# Patient Record
Sex: Female | Born: 1975 | Race: White | Hispanic: No | Marital: Married | Smoking: Never smoker
Health system: Southern US, Community
[De-identification: ages and names within clinical notes are randomized; demographics above are authoritative.]

## PROBLEM LIST (undated history)

## (undated) DIAGNOSIS — E119 Type 2 diabetes mellitus without complications: Secondary | ICD-10-CM

## (undated) DIAGNOSIS — C801 Malignant (primary) neoplasm, unspecified: Secondary | ICD-10-CM

## (undated) DIAGNOSIS — E0590 Thyrotoxicosis, unspecified without thyrotoxic crisis or storm: Secondary | ICD-10-CM

## (undated) HISTORY — PX: THYROIDECTOMY, PARTIAL: SHX18

## (undated) HISTORY — PX: WISDOM TOOTH EXTRACTION: SHX21

## (undated) HISTORY — PX: CERVICAL FUSION: SHX112

## (undated) HISTORY — PX: ABCESS DRAINAGE: SHX399

---

## 2003-10-19 ENCOUNTER — Other Ambulatory Visit: Admission: RE | Admit: 2003-10-19 | Discharge: 2003-10-19 | Payer: Self-pay | Admitting: *Deleted

## 2008-11-24 ENCOUNTER — Encounter: Admission: RE | Admit: 2008-11-24 | Discharge: 2008-11-24 | Payer: Self-pay | Admitting: Neurology

## 2010-09-16 ENCOUNTER — Encounter: Payer: Self-pay | Admitting: Neurology

## 2011-04-08 ENCOUNTER — Other Ambulatory Visit (HOSPITAL_COMMUNITY): Payer: Self-pay | Admitting: Gastroenterology

## 2011-04-08 DIAGNOSIS — R945 Abnormal results of liver function studies: Secondary | ICD-10-CM

## 2011-04-09 ENCOUNTER — Other Ambulatory Visit: Payer: Self-pay | Admitting: Gastroenterology

## 2011-04-09 ENCOUNTER — Ambulatory Visit (HOSPITAL_COMMUNITY)
Admission: RE | Admit: 2011-04-09 | Discharge: 2011-04-09 | Disposition: A | Discharge status: Home / Self Care | Payer: BC Managed Care – PPO | Source: Ambulatory Visit | Attending: Gastroenterology | Admitting: Gastroenterology

## 2011-04-09 DIAGNOSIS — K7689 Other specified diseases of liver: Secondary | ICD-10-CM | POA: Insufficient documentation

## 2011-04-09 DIAGNOSIS — R7989 Other specified abnormal findings of blood chemistry: Secondary | ICD-10-CM | POA: Insufficient documentation

## 2011-04-09 DIAGNOSIS — R945 Abnormal results of liver function studies: Secondary | ICD-10-CM

## 2011-04-09 LAB — PROTIME-INR
INR: 1.01 (ref 0.00–1.49)
Prothrombin Time: 13.5 seconds (ref 11.6–15.2)

## 2011-04-09 LAB — CBC
MCH: 27.8 pg (ref 26.0–34.0)
MCHC: 32.5 g/dL (ref 30.0–36.0)
Platelets: 240 10*3/uL (ref 150–400)
RDW: 13.7 % (ref 11.5–15.5)

## 2011-04-09 LAB — GLUCOSE, CAPILLARY: Glucose-Capillary: 213 mg/dL — ABNORMAL HIGH (ref 70–99)

## 2011-04-09 LAB — APTT: aPTT: 30 seconds (ref 24–37)

## 2012-03-02 ENCOUNTER — Other Ambulatory Visit: Payer: Self-pay | Admitting: Internal Medicine

## 2012-03-02 DIAGNOSIS — C73 Malignant neoplasm of thyroid gland: Secondary | ICD-10-CM

## 2012-03-06 ENCOUNTER — Ambulatory Visit
Admission: RE | Admit: 2012-03-06 | Discharge: 2012-03-06 | Disposition: A | Discharge status: Home / Self Care | Payer: BC Managed Care – PPO | Source: Ambulatory Visit | Attending: Internal Medicine | Admitting: Internal Medicine

## 2012-03-06 DIAGNOSIS — C73 Malignant neoplasm of thyroid gland: Secondary | ICD-10-CM

## 2012-09-09 ENCOUNTER — Other Ambulatory Visit: Payer: Self-pay | Admitting: Internal Medicine

## 2012-09-09 DIAGNOSIS — C73 Malignant neoplasm of thyroid gland: Secondary | ICD-10-CM

## 2012-09-14 ENCOUNTER — Ambulatory Visit
Admission: RE | Admit: 2012-09-14 | Discharge: 2012-09-14 | Disposition: A | Discharge status: Home / Self Care | Payer: BC Managed Care – PPO | Source: Ambulatory Visit | Attending: Internal Medicine | Admitting: Internal Medicine

## 2012-09-14 DIAGNOSIS — C73 Malignant neoplasm of thyroid gland: Secondary | ICD-10-CM

## 2015-01-05 ENCOUNTER — Other Ambulatory Visit: Payer: Self-pay | Admitting: Internal Medicine

## 2015-01-05 DIAGNOSIS — C73 Malignant neoplasm of thyroid gland: Secondary | ICD-10-CM

## 2015-02-23 ENCOUNTER — Other Ambulatory Visit: Payer: BC Managed Care – PPO

## 2015-02-28 ENCOUNTER — Ambulatory Visit
Admission: RE | Admit: 2015-02-28 | Discharge: 2015-02-28 | Disposition: A | Discharge status: Home / Self Care | Payer: BC Managed Care – PPO | Source: Ambulatory Visit | Attending: Internal Medicine | Admitting: Internal Medicine

## 2015-02-28 DIAGNOSIS — C73 Malignant neoplasm of thyroid gland: Secondary | ICD-10-CM

## 2016-02-01 ENCOUNTER — Other Ambulatory Visit: Payer: Self-pay | Admitting: Internal Medicine

## 2016-02-01 DIAGNOSIS — R1010 Upper abdominal pain, unspecified: Secondary | ICD-10-CM

## 2016-02-07 ENCOUNTER — Other Ambulatory Visit: Payer: BC Managed Care – PPO

## 2016-02-12 ENCOUNTER — Other Ambulatory Visit: Payer: BC Managed Care – PPO

## 2016-02-13 ENCOUNTER — Ambulatory Visit
Admission: RE | Admit: 2016-02-13 | Discharge: 2016-02-13 | Disposition: A | Discharge status: Home / Self Care | Payer: BC Managed Care – PPO | Source: Ambulatory Visit | Attending: Internal Medicine | Admitting: Internal Medicine

## 2016-02-13 DIAGNOSIS — R1010 Upper abdominal pain, unspecified: Secondary | ICD-10-CM

## 2016-09-03 ENCOUNTER — Other Ambulatory Visit: Payer: Self-pay | Admitting: Internal Medicine

## 2016-09-03 ENCOUNTER — Ambulatory Visit
Admission: RE | Admit: 2016-09-03 | Discharge: 2016-09-03 | Disposition: A | Discharge status: Home / Self Care | Payer: BC Managed Care – PPO | Source: Ambulatory Visit | Attending: Internal Medicine | Admitting: Internal Medicine

## 2016-09-03 DIAGNOSIS — J209 Acute bronchitis, unspecified: Secondary | ICD-10-CM

## 2016-11-12 ENCOUNTER — Other Ambulatory Visit: Payer: Self-pay | Admitting: Internal Medicine

## 2016-11-12 DIAGNOSIS — E890 Postprocedural hypothyroidism: Secondary | ICD-10-CM

## 2016-11-12 DIAGNOSIS — C73 Malignant neoplasm of thyroid gland: Secondary | ICD-10-CM

## 2016-11-12 DIAGNOSIS — Z85850 Personal history of malignant neoplasm of thyroid: Secondary | ICD-10-CM

## 2016-11-18 ENCOUNTER — Other Ambulatory Visit: Payer: Self-pay | Admitting: Internal Medicine

## 2016-11-18 ENCOUNTER — Ambulatory Visit
Admission: RE | Admit: 2016-11-18 | Discharge: 2016-11-18 | Disposition: A | Discharge status: Home / Self Care | Payer: BC Managed Care – PPO | Source: Ambulatory Visit | Attending: Internal Medicine | Admitting: Internal Medicine

## 2016-11-18 DIAGNOSIS — C73 Malignant neoplasm of thyroid gland: Secondary | ICD-10-CM

## 2016-11-18 DIAGNOSIS — E890 Postprocedural hypothyroidism: Secondary | ICD-10-CM

## 2016-11-18 DIAGNOSIS — R109 Unspecified abdominal pain: Secondary | ICD-10-CM

## 2016-11-18 DIAGNOSIS — Z85850 Personal history of malignant neoplasm of thyroid: Secondary | ICD-10-CM

## 2016-11-19 ENCOUNTER — Other Ambulatory Visit: Payer: BC Managed Care – PPO

## 2016-11-28 ENCOUNTER — Ambulatory Visit
Admission: RE | Admit: 2016-11-28 | Discharge: 2016-11-28 | Disposition: A | Discharge status: Home / Self Care | Payer: BC Managed Care – PPO | Source: Ambulatory Visit | Attending: Internal Medicine | Admitting: Internal Medicine

## 2016-11-28 DIAGNOSIS — R109 Unspecified abdominal pain: Secondary | ICD-10-CM

## 2017-09-23 ENCOUNTER — Other Ambulatory Visit: Payer: Self-pay | Admitting: Internal Medicine

## 2017-09-23 DIAGNOSIS — L680 Hirsutism: Secondary | ICD-10-CM

## 2017-09-23 DIAGNOSIS — N915 Oligomenorrhea, unspecified: Secondary | ICD-10-CM

## 2017-09-30 ENCOUNTER — Ambulatory Visit: Payer: Self-pay | Admitting: Podiatry

## 2017-09-30 ENCOUNTER — Ambulatory Visit (INDEPENDENT_AMBULATORY_CARE_PROVIDER_SITE_OTHER): Payer: BC Managed Care – PPO

## 2017-09-30 DIAGNOSIS — M722 Plantar fascial fibromatosis: Secondary | ICD-10-CM | POA: Diagnosis not present

## 2017-09-30 MED ORDER — MELOXICAM 15 MG PO TABS: 15.0000 mg | ORAL_TABLET | Freq: Every day | ORAL | 3 refills | Status: DC

## 2017-09-30 NOTE — Patient Instructions (Signed)

## 2017-09-30 NOTE — Progress Notes (Signed)
  Subjective:  Patient ID: Natalie Ramirez, female    DOB: 1976/08/02,  MRN: [id number] HPI Chief Complaint  Patient presents with  . Foot Pain    [name]; 7/10 achy pain -No injury Tx: icing, stretching, and cusotm orthotics (helps)    42 y.o. female presents with the above complaint.     No past medical history on file.  No current outpatient medications on file.  Allergies not on file Review of Systems  All other systems reviewed and are negative.  Objective:  There were no vitals filed for this visit.  General: Well developed, nourished, in no acute distress, alert and oriented x3   Dermatological: Skin is warm, dry and supple bilateral. Nails x 10 are well maintained; remaining integument appears unremarkable at this time. There are no open sores, no preulcerative lesions, no rash or signs of infection present.  Vascular: Dorsalis Pedis artery and Posterior Tibial artery pedal pulses are 2/4 bilateral with immedate capillary fill time. Pedal hair growth present. No varicosities and no lower extremity edema present bilateral.   Neruologic: Grossly intact via light touch bilateral. Vibratory intact via tuning fork bilateral. Protective threshold with Semmes Wienstein monofilament intact to all pedal sites bilateral. Patellar and Achilles deep tendon reflexes 2+ bilateral. No Babinski or clonus noted bilateral.   Musculoskeletal: No gross boney pedal deformities bilateral. No pain, crepitus, or limitation noted with foot and ankle range of motion bilateral. Muscular strength 5/5 in all groups tested bilateral.  She has pain on palpation medial calcaneal tubercle of the bilateral foot.  Gait: Unassisted, Nonantalgic.    Radiographs:  3 views of bilateral foot were taken today no acute derangement is noted.  She does have plantar and posterior calcaneal heel spurs with a soft tissue increase in density at the plantar fascial calcaneal  insertion site.  Pes planus is also observed.  No coalitions are noted.  Assessment & Plan:   Assessment: Chronic intractable plantar fasciitis times 4 years left greater than right.  Plan: Discussed etiology pathology conservative versus surgical therapies.  At this point since she is a diabetic we are not able to use prednisone.  I will use cortisone which was injected today 20 mg of Kenalog with 5 mg Marcaine to the point of maximal tenderness after sterile Betadine skin prep.  I also started her on meloxicam.  Placed on a plantar fascial brace bilateral and a night splint left.  We discussed appropriate shoe gear stretching exercises ice therapy and shoe modifications she is provided with both oral and written home-going instructions in care for her feet.  I will follow-up with her in 1 month.  She will bring her orthotics with her the next time she comes for [place] to evaluate him.     Natalie Ramirez T. [place], [place]

## 2017-10-02 ENCOUNTER — Other Ambulatory Visit: Payer: BC Managed Care – PPO

## 2017-11-06 ENCOUNTER — Encounter: Payer: Self-pay | Admitting: Podiatry

## 2017-11-06 ENCOUNTER — Ambulatory Visit: Payer: BC Managed Care – PPO | Admitting: Podiatry

## 2017-11-06 DIAGNOSIS — M722 Plantar fascial fibromatosis: Secondary | ICD-10-CM

## 2017-11-08 NOTE — Progress Notes (Signed)
She presents today for follow-up of bilateral plantar fasciitis.  She states that is doing so much better than it was before.  Objective: Vital signs are stable alert and oriented x3.  Pulses are palpable.  Neurologic sensorium is intact.  Deep tendon reflexes are intact muscle strength was 5/5 dorsiflexors plantar flexors inverters everters all intrinsic musculature is intact.  Assessment: Resolving plantar fasciitis.  Plan: Discussed with her in great detail today that she should continue all conservative therapies including plantar fascial brace night splint appropriate shoe gear stretching exercises ice therapy and medication.  I will follow-up with her in 1 month if necessary.

## 2017-11-17 ENCOUNTER — Other Ambulatory Visit: Payer: BC Managed Care – PPO | Admitting: Orthotics

## 2017-11-20 ENCOUNTER — Ambulatory Visit (INDEPENDENT_AMBULATORY_CARE_PROVIDER_SITE_OTHER): Payer: BC Managed Care – PPO | Admitting: Orthotics

## 2017-11-20 DIAGNOSIS — M722 Plantar fascial fibromatosis: Secondary | ICD-10-CM | POA: Diagnosis not present

## 2017-11-20 NOTE — Progress Notes (Signed)

## 2017-11-24 ENCOUNTER — Other Ambulatory Visit: Payer: BC Managed Care – PPO | Admitting: Orthotics

## 2017-12-11 ENCOUNTER — Ambulatory Visit: Payer: BC Managed Care – PPO | Admitting: Orthotics

## 2017-12-11 DIAGNOSIS — M722 Plantar fascial fibromatosis: Secondary | ICD-10-CM

## 2017-12-11 NOTE — Progress Notes (Signed)
Patient came in today to pick up custom made foot orthotics.  The goals were accomplished and the patient reported no dissatisfaction with said orthotics.  Patient was advised of breakin period and how to report any issues. 

## 2017-12-23 ENCOUNTER — Inpatient Hospital Stay (HOSPITAL_COMMUNITY): Payer: BC Managed Care – PPO

## 2017-12-23 ENCOUNTER — Inpatient Hospital Stay (HOSPITAL_COMMUNITY)
Admission: AD | Admit: 2017-12-23 | Discharge: 2017-12-23 | Disposition: A | Discharge status: Home / Self Care | Payer: BC Managed Care – PPO | Source: Ambulatory Visit | Attending: Obstetrics and Gynecology | Admitting: Obstetrics and Gynecology

## 2017-12-23 ENCOUNTER — Encounter (HOSPITAL_COMMUNITY): Payer: Self-pay | Admitting: *Deleted

## 2017-12-23 DIAGNOSIS — IMO0002 Reserved for concepts with insufficient information to code with codable children: Secondary | ICD-10-CM

## 2017-12-23 DIAGNOSIS — O200 Threatened abortion: Secondary | ICD-10-CM | POA: Diagnosis not present

## 2017-12-23 DIAGNOSIS — Z3A08 8 weeks gestation of pregnancy: Secondary | ICD-10-CM | POA: Diagnosis not present

## 2017-12-23 DIAGNOSIS — R799 Abnormal finding of blood chemistry, unspecified: Secondary | ICD-10-CM | POA: Diagnosis not present

## 2017-12-23 DIAGNOSIS — O209 Hemorrhage in early pregnancy, unspecified: Secondary | ICD-10-CM | POA: Diagnosis present

## 2017-12-23 HISTORY — DX: Type 2 diabetes mellitus without complications: E11.9

## 2017-12-23 LAB — URINALYSIS, ROUTINE W REFLEX MICROSCOPIC
Bilirubin Urine: NEGATIVE
Glucose, UA: NEGATIVE mg/dL
KETONES UR: NEGATIVE mg/dL
Leukocytes, UA: NEGATIVE
Nitrite: NEGATIVE
PH: 7 (ref 5.0–8.0)
PROTEIN: NEGATIVE mg/dL
Specific Gravity, Urine: 1.015 (ref 1.005–1.030)

## 2017-12-23 LAB — CBC
HCT: 35.7 % — ABNORMAL LOW (ref 36.0–46.0)
Hemoglobin: 11.6 g/dL — ABNORMAL LOW (ref 12.0–15.0)
MCH: 28.5 pg (ref 26.0–34.0)
MCHC: 32.5 g/dL (ref 30.0–36.0)
MCV: 87.7 fL (ref 78.0–100.0)
Platelets: 258 10*3/uL (ref 150–400)
RBC: 4.07 MIL/uL (ref 3.87–5.11)
RDW: 14.5 % (ref 11.5–15.5)
WBC: 12.5 10*3/uL — AB (ref 4.0–10.5)

## 2017-12-23 LAB — COMPREHENSIVE METABOLIC PANEL
ALBUMIN: 3.7 g/dL (ref 3.5–5.0)
ALK PHOS: 61 U/L (ref 38–126)
ALT: 24 U/L (ref 14–54)
AST: 20 U/L (ref 15–41)
Anion gap: 9 (ref 5–15)
BILIRUBIN TOTAL: 0.2 mg/dL — AB (ref 0.3–1.2)
BUN: 12 mg/dL (ref 6–20)
CALCIUM: 9.1 mg/dL (ref 8.9–10.3)
CO2: 22 mmol/L (ref 22–32)
Chloride: 105 mmol/L (ref 101–111)
Creatinine, Ser: 0.58 mg/dL (ref 0.44–1.00)
GFR calc Af Amer: >60 mL/min (ref >60)
GFR calc non Af Amer: >60 mL/min (ref >60)
GLUCOSE: 89 mg/dL (ref 65–99)
Potassium: 4 mmol/L (ref 3.5–5.1)
Sodium: 136 mmol/L (ref 135–145)
TOTAL PROTEIN: 7.2 g/dL (ref 6.5–8.1)

## 2017-12-23 LAB — ABO/RH: ABO/RH(D): A POS

## 2017-12-23 LAB — HCG, QUANTITATIVE, PREGNANCY: hCG, Beta Chain, Quant, S: 877 m[IU]/mL — ABNORMAL HIGH (ref <5)

## 2017-12-23 LAB — POCT PREGNANCY, URINE: Preg Test, Ur: POSITIVE — AB

## 2017-12-23 MED ORDER — TRAMADOL HCL 50 MG PO TABS
100.0000 mg | ORAL_TABLET | Freq: Once | ORAL | Status: AC
  Administered 2017-12-23: 100 mg via ORAL
  Filled 2017-12-23: qty 2

## 2017-12-23 MED ORDER — TRAMADOL HCL 50 MG PO TABS: 50.0000 mg | ORAL_TABLET | Freq: Four times a day (QID) | ORAL | 0 refills | Status: AC | PRN

## 2017-12-23 NOTE — MAU Provider Note (Signed)
Chief Complaint: Vaginal Bleeding and Abdominal Pain   First Provider Initiated Contact with Patient 12/23/17 1834     SUBJECTIVE HPI: Natalie Ramirez is a 42 y.o. G1P0 at [redacted]w[redacted]d who was sent to Maternity Admissions by Dr. Philis Pique for abnormal rise in HCG levels at [facility]. She started having low abs pain and moderate vaginal bleeding w/ clots 12/21/17.   Vaginal Bleeding: small-mod Passage of tissue or clots: Small-mod Dizziness: Denies  A POS Performed at [facility], [address], [place], [place] [address]  Labs not available from office. Pt has HCG levels written as follows: 4/5 117 4/12 514 4/18 693 4/26 852  Pain Location: low abd Quality: cramping Severity: Mild Duration: <48 hours Course: worsening Context: early pregnancy w/ slow rise in HCGs Timing: intermittent Modifying factors: None. Hasn't tried anything Associated signs and symptoms: Pos for vaginal bleeding. Neg for fever, chills, urinary complaints, GI complaints, vaginal discharge or passage of tissue or clots.   Past Medical History:  Diagnosis Date  . Diabetes mellitus without complication ([place])    OB History  Gravida Para Term Preterm AB Living  1            SAB TAB Ectopic Multiple Live Births               # Outcome Date GA Lbr Len/2nd Weight Sex Delivery Anes PTL Lv  1 Current            Past Surgical History:  Procedure Laterality Date  . ABCESS DRAINAGE    . CERVICAL FUSION    . THYROIDECTOMY, PARTIAL    . WISDOM TOOTH EXTRACTION     Social History   Socioeconomic History  . Marital status: Married    Spouse name: Not on file  . Number of children: Not on file  . Years of education: Not on file  . Highest education level: Not on file  Occupational History  . Not on file  Social Needs  . Financial resource strain: Not on file  . Food insecurity:    Worry: Not on file    Inability: Not on file  . Transportation needs:    Medical: Not on file    Non-medical: Not  on file  Tobacco Use  . Smoking status: Unknown If Ever Smoked  . Smokeless tobacco: Never Used  Substance and Sexual Activity  . Alcohol use: Not Currently  . Drug use: Not Currently  . Sexual activity: Not on file  Lifestyle  . Physical activity:    Days per week: Not on file    Minutes per session: Not on file  . Stress: Not on file  Relationships  . Social connections:    Talks on phone: Not on file    Gets together: Not on file    Attends religious service: Not on file    Active member of club or organization: Not on file    Attends meetings of clubs or organizations: Not on file    Relationship status: Not on file  . Intimate partner violence:    Fear of current or ex partner: Not on file    Emotionally abused: Not on file    Physically abused: Not on file    Forced sexual activity: Not on file  Other Topics Concern  . Not on file  Social History Narrative  . Not on file   No current facility-administered medications on file prior to encounter.    Current Outpatient Medications on File Prior  to Encounter  Medication Sig Dispense Refill  . acyclovir (ZOVIRAX) 400 MG tablet Take 400 mg by mouth 5 (five) times daily.    . clomiPHENE (CLOMID) 50 MG tablet Take by mouth daily.    . cycloSPORINE (RESTASIS) 0.05 % ophthalmic emulsion 1 drop 2 (two) times daily.    Marland Kitchen levothyroxine (SYNTHROID, LEVOTHROID) 75 MCG tablet Take 75 mcg by mouth daily before breakfast.    . meloxicam (MOBIC) 15 MG tablet Take 1 tablet (15 mg total) by mouth daily. 30 tablet 3  . metFORMIN (GLUCOPHAGE) 500 MG tablet Take 500 mg by mouth 2 (two) times daily with a meal.     No Known Allergies  I have reviewed the past Medical Hx, Surgical Hx, Social Hx, Allergies and Medications.   Review of Systems  Constitutional: Negative for appetite change, chills and fever.  Gastrointestinal: Positive for abdominal pain. Negative for constipation, diarrhea, nausea and vomiting.  Genitourinary: Positive for  vaginal bleeding. Negative for dysuria, frequency, hematuria, urgency and vaginal discharge.  Neurological: Negative for dizziness.    OBJECTIVE Patient Vitals for the past 24 hrs:  BP Temp Temp src Pulse Resp SpO2 Height Weight  12/23/17 1748 (!) 146/91 98.3 F (36.8 C) Oral 89 18 100 % 5\' 6"  (1.676 m) 222 lb (100.7 kg)   Constitutional: Well-developed, well-nourished. obese female in no acute distress. Anxious.  Cardiovascular: normal rate Respiratory: normal rate and effort.  GI: Abd soft, non-tender. Pos BS x 4 MS: Extremities nontender, no edema, normal ROM Neurologic: Alert and oriented x 4.  GU: Neg CVAT.  SPECULUM EXAM: NEFG, physiologic discharge, small amount of bloody mucus noted, cervix friable, normal ectropion. No mucopurulent discharge  BIMANUAL: cervix closed; UTA uterine size 2/2 body habitus. No obviously enlarged. No adnexal tenderness or masses. No CMT.  LAB RESULTS Results for orders placed or performed during the hospital encounter of 12/23/17 (from the past 24 hour(s))  Urinalysis, Routine w reflex microscopic     Status: Abnormal   Collection Time: 12/23/17  5:50 PM  Result Value Ref Range   Color, Urine YELLOW YELLOW   APPearance CLEAR CLEAR   Specific Gravity, Urine 1.015 1.005 - 1.030   pH 7.0 5.0 - 8.0   Glucose, UA NEGATIVE NEGATIVE mg/dL   Hgb urine dipstick LARGE (A) NEGATIVE   Bilirubin Urine NEGATIVE NEGATIVE   Ketones, ur NEGATIVE NEGATIVE mg/dL   Protein, ur NEGATIVE NEGATIVE mg/dL   Nitrite NEGATIVE NEGATIVE   Leukocytes, UA NEGATIVE NEGATIVE   RBC / HPF >50 (H) 0 - 5 RBC/hpf   WBC, UA 0-5 0 - 5 WBC/hpf   Bacteria, UA RARE (A) NONE SEEN   Squamous Epithelial / LPF 0-5 0 - 5   Mucus PRESENT   Pregnancy, urine POC     Status: Abnormal   Collection Time: 12/23/17  6:23 PM  Result Value Ref Range   Preg Test, Ur POSITIVE (A) NEGATIVE  hCG, quantitative, pregnancy     Status: Abnormal   Collection Time: 12/23/17  6:38 PM  Result Value Ref  Range   hCG, Beta Chain, Quant, S 877 (H) <5 mIU/mL  CBC     Status: Abnormal   Collection Time: 12/23/17  6:38 PM  Result Value Ref Range   WBC 12.5 (H) 4.0 - 10.5 K/uL   RBC 4.07 3.87 - 5.11 MIL/uL   Hemoglobin 11.6 (L) 12.0 - 15.0 g/dL   HCT 35.7 (L) 36.0 - 46.0 %   MCV 87.7 78.0 - 100.0 fL  MCH 28.5 26.0 - 34.0 pg   MCHC 32.5 30.0 - 36.0 g/dL   RDW 14.5 11.5 - 15.5 %   Platelets 258 150 - 400 K/uL  ABO/Rh     Status: None (Preliminary result)   Collection Time: 12/23/17  6:38 PM  Result Value Ref Range   ABO/RH(D)      A POS Performed at [facility], [address] [place], [place] [address]   Comprehensive metabolic panel     Status: Abnormal   Collection Time: 12/23/17  6:38 PM  Result Value Ref Range   Sodium 136 135 - 145 mmol/L   Potassium 4.0 3.5 - 5.1 mmol/L   Chloride 105 101 - 111 mmol/L   CO2 22 22 - 32 mmol/L   Glucose, Bld 89 65 - 99 mg/dL   BUN 12 6 - 20 mg/dL   Creatinine, Ser 0.58 0.44 - 1.00 mg/dL   Calcium 9.1 8.9 - 10.3 mg/dL   Total Protein 7.2 6.5 - 8.1 g/dL   Albumin 3.7 3.5 - 5.0 g/dL   AST 20 15 - 41 U/L   ALT 24 14 - 54 U/L   Alkaline Phosphatase 61 38 - 126 U/L   Total Bilirubin 0.2 (L) 0.3 - 1.2 mg/dL   GFR calc non Af Amer >60 >60 mL/min   GFR calc Af Amer >60 >60 mL/min   Anion gap 9 5 - 15    IMAGING US Ob Comp Less 14 Wks  Result Date: 12/23/2017 CLINICAL DATA:  Vaginal bleeding, quantitative HCG of 877 EXAM: OBSTETRIC <14 WK [place] AND TRANSVAGINAL OB US TECHNIQUE: Both transabdominal and transvaginal ultrasound examinations were performed for complete evaluation of the gestation as well as the maternal uterus, adnexal regions, and pelvic cul-de-sac. Transvaginal technique was performed to assess early pregnancy. COMPARISON:  None. FINDINGS: Intrauterine gestational sac: Questionable intrauterine gestational sac Yolk sac:  Not seen Embryo:  Not seen MSD: 4.1 mm   5 w   1 d Subchorionic hemorrhage:  None visualized. Maternal  uterus/adnexae: Ovaries are within normal limits. Right ovary measures 2.8 x 2.4 x 2.9 cm. Left ovary measures 2.9 x 1.7 x 2.5 cm. No significant free fluid. IMPRESSION: Questionable early intrauterine gestational sac, but no yolk sac, fetal pole, or cardiac activity yet visualized. Recommend follow-up quantitative B-HCG levels and follow-up US in 14 days to assess viability. This recommendation follows SRU consensus guidelines: Diagnostic Criteria for Nonviable Pregnancy Early in the First Trimester. [place] Med 2013; 643:3295-18. Electronically Signed   By: Donavan Foil M.D.   On: 12/23/2017 20:03   US Ob Transvaginal  Result Date: 12/23/2017 CLINICAL DATA:  Vaginal bleeding, quantitative HCG of 877 EXAM: OBSTETRIC <14 WK [place] AND TRANSVAGINAL OB US TECHNIQUE: Both transabdominal and transvaginal ultrasound examinations were performed for complete evaluation of the gestation as well as the maternal uterus, adnexal regions, and pelvic cul-de-sac. Transvaginal technique was performed to assess early pregnancy. COMPARISON:  None. FINDINGS: Intrauterine gestational sac: Questionable intrauterine gestational sac Yolk sac:  Not seen Embryo:  Not seen MSD: 4.1 mm   5 w   1 d Subchorionic hemorrhage:  None visualized. Maternal uterus/adnexae: Ovaries are within normal limits. Right ovary measures 2.8 x 2.4 x 2.9 cm. Left ovary measures 2.9 x 1.7 x 2.5 cm. No significant free fluid. IMPRESSION: Questionable early intrauterine gestational sac, but no yolk sac, fetal pole, or cardiac activity yet visualized. Recommend follow-up quantitative B-HCG levels and follow-up US in 14 days to assess viability. This  recommendation follows SRU consensus guidelines: Diagnostic Criteria for Nonviable Pregnancy Early in the First Trimester. [employer] Med 2013; 258:5277-82. Electronically Signed   By: Donavan Foil M.D.   On: 12/23/2017 20:03    MAU COURSE CBC, Quant, ABO/Rh, ultrasound, UA.  Pt and spouse with MANY questions and  concerns about HCG levels. CNM confirmed that they are not consistent with a normally progressing pregnancy. This MAU evaluation is primarily ectopic work-up.   Discussed Hx, labs, exam w/ Dr. Philis Pique. No Wet Prep, GC/Chlamydia needed due to recent exam in office. Agrees w/ POC. New orders: None. Called patient in room to discuss follow-up in office and POC.   MDM Pain and bleeding in early pregnancy with pregnancy of unknown anatomic location, but hemodynamically stable.   ASSESSMENT 1. Threatened miscarriage   2. Abnormal human chorionic gonadotropin (hCG)     PLAN Discharge home in stable condition. SAB/Ectopic precautions Support given. Follow-up Information    Bobbye Charleston, MD Follow up.   Specialty:  Obstetrics and Gynecology Why:  as scheduled or sooner as needed if symptoms worsen Contact information: [place] [address] [place] [address] [place] Follow up.   Why:  as needed in emergencies Contact information: [address] [id number] [place] [phone]         Allergies as of 12/23/2017   No Known Allergies     Medication List    STOP taking these medications   clomiPHENE 50 MG tablet Commonly known as:  CLOMID   meloxicam 15 MG tablet Commonly known as:  MOBIC     TAKE these medications   acyclovir 400 MG tablet Commonly known as:  ZOVIRAX Take 400 mg by mouth 5 (five) times daily.   cycloSPORINE 0.05 % ophthalmic emulsion Commonly known as:  RESTASIS 1 drop 2 (two) times daily.   insulin glargine 100 UNIT/ML injection Commonly known as:  LANTUS Inject 25 Units into the skin every morning.   levothyroxine 75 MCG tablet Commonly known as:  SYNTHROID, LEVOTHROID Take 100 mcg by mouth daily before breakfast.   metFORMIN 500 MG tablet Commonly known as:  GLUCOPHAGE Take 500 mg by mouth 2 (two) times daily with a  meal.   prenatal multivitamin Tabs tablet Take 1 tablet by mouth daily at 12 noon.   traMADol 50 MG tablet Commonly known as:  ULTRAM Take 1 tablet (50 mg total) by mouth every 6 (six) hours as needed.        Tamala Julian, [place], [place] 12/23/2017  8:30 PM  4

## 2017-12-23 NOTE — Discharge Instructions (Signed)
Threatened Miscarriage A threatened miscarriage occurs when you have vaginal bleeding during your first 20 weeks of pregnancy but the pregnancy has not ended. If you have vaginal bleeding during this time, your health care provider will do tests to make sure you are still pregnant. If the tests show you are still pregnant and the developing baby (fetus) inside your womb (uterus) is still growing, your condition is considered a threatened miscarriage. A threatened miscarriage does not mean your pregnancy will end, but it does increase the risk of losing your pregnancy (complete miscarriage). What are the causes? The cause of a threatened miscarriage is usually not known. If you go on to have a complete miscarriage, the most common cause is an abnormal number of chromosomes in the developing baby. Chromosomes are the structures inside cells that hold all your genetic material. Some causes of vaginal bleeding that do not result in miscarriage include:  Having sex.  Having an infection.  Normal hormone changes of pregnancy.  Bleeding that occurs when an egg implants in your uterus.  What increases the risk? Risk factors for bleeding in early pregnancy include:  Obesity.  Smoking.  Drinking excessive amounts of alcohol or caffeine.  Recreational drug use.  What are the signs or symptoms?  Light vaginal bleeding.  Mild abdominal pain or cramps. How is this diagnosed? If you have bleeding with or without abdominal pain before 20 weeks of pregnancy, your health care provider will do tests to check whether you are still pregnant. One important test involves using sound waves and a computer (ultrasound) to create images of the inside of your uterus. Other tests include an internal exam of your vagina and uterus (pelvic exam) and measurement of your babys heart rate. You may be diagnosed with a threatened miscarriage if:  Ultrasound testing shows you are still pregnant.  Your babys heart  rate is strong.  A pelvic exam shows that the opening between your uterus and your vagina (cervix) is closed.  Your heart rate and blood pressure are stable.  Blood tests confirm you are still pregnant.  How is this treated? No treatments have been shown to prevent a threatened miscarriage from going on to a complete miscarriage. However, the right home care is important. Follow these instructions at home:  Make sure you keep all your appointments for prenatal care. This is very important.  Get plenty of rest.  Do not have sex or use tampons if you have vaginal bleeding.  Do not douche.  Do not smoke or use recreational drugs.  Do not drink alcohol.  Avoid caffeine. Contact a health care provider if:  You have light vaginal bleeding or spotting while pregnant.  You have abdominal pain or cramping.  You have a fever. Get help right away if:  You have heavy vaginal bleeding.  You have blood clots coming from your vagina.  You have severe low back pain or abdominal cramps.  You have fever, chills, and severe abdominal pain. This information is not intended to replace advice given to you by your health care provider. Make sure you discuss any questions you have with your health care provider. Document Released: 08/12/2005 Document Revised: 01/18/2016 Document Reviewed: 06/08/2013 Elsevier Interactive Patient Education  2018 [employer].   Vaginal Bleeding During Pregnancy, First Trimester A small amount of bleeding (spotting) from the vagina is common in early pregnancy. Sometimes the bleeding is normal and is not a problem, and sometimes it is a sign of something serious. Be sure  to tell your doctor about any bleeding from your vagina right away. Follow these instructions at home:  Watch your condition for any changes.  Follow your doctor's instructions about how active you can be.  If you are on bed rest: ? You may need to stay in bed and only get up to use the  bathroom. ? You may be allowed to do some activities. ? If you need help, make plans for someone to help you.  Write down: ? The number of pads you use each day. ? How often you change pads. ? How soaked (saturated) your pads are.  Do not use tampons.  Do not douche.  Do not have sex or orgasms until your doctor says it is okay.  If you pass any tissue from your vagina, save the tissue so you can show it to your doctor.  Only take medicines as told by your doctor.  Do not take aspirin because it can make you bleed.  Keep all follow-up visits as told by your doctor. Contact a doctor if:  You bleed from your vagina.  You have cramps.  You have labor pains.  You have a fever that does not go away after you take medicine. Get help right away if:  You have very bad cramps in your back or belly (abdomen).  You pass large clots or tissue from your vagina.  You bleed more.  You feel light-headed or weak.  You pass out (faint).  You have chills.  You are leaking fluid or have a gush of fluid from your vagina.  You pass out while pooping (having a bowel movement). This information is not intended to replace advice given to you by your health care provider. Make sure you discuss any questions you have with your health care provider. Document Released: 12/27/2013 Document Revised: 01/18/2016 Document Reviewed: 04/19/2013 Elsevier Interactive Patient Education  [employer].

## 2017-12-23 NOTE — MAU Note (Signed)
Pt reports vaginal bleeding off/on x 2 weeks, worsening over the last 2 days. HCGQ's not rising appropriately .

## 2017-12-24 LAB — HIV ANTIBODY (ROUTINE TESTING W REFLEX): HIV SCREEN 4TH GENERATION: NONREACTIVE

## 2018-03-09 ENCOUNTER — Other Ambulatory Visit: Payer: Self-pay | Admitting: Podiatry

## 2018-04-13 ENCOUNTER — Other Ambulatory Visit: Payer: Self-pay | Admitting: Podiatry

## 2018-04-17 IMAGING — US US THYROID
1 series · 13 of 25 positions shown · non-contrast
Comparison: 02/28/2015

CLINICAL DATA: 40-year-old female with a history of thyroid
carcinoma with partial thyroidectomy 5448

EXAM:
THYROID ULTRASOUND
TECHNIQUE: Ultrasound examination of the thyroid gland and adjacent soft
tissues was performed.

[Series 1: us thyroid · 0.04mm/px · 13 of 46 slices shown]
[im 1/46]
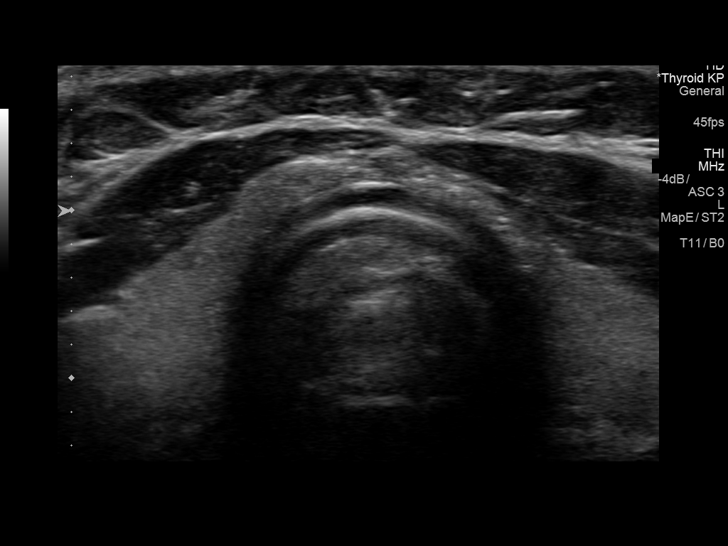
[im 4/46]
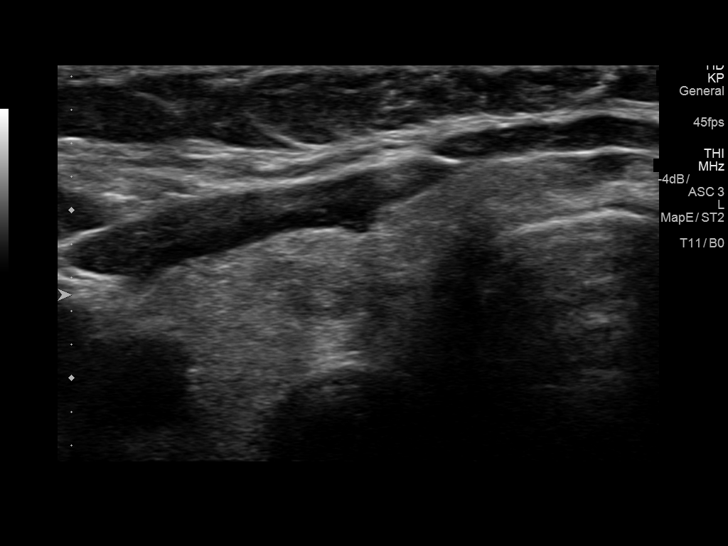
[im 8/46]
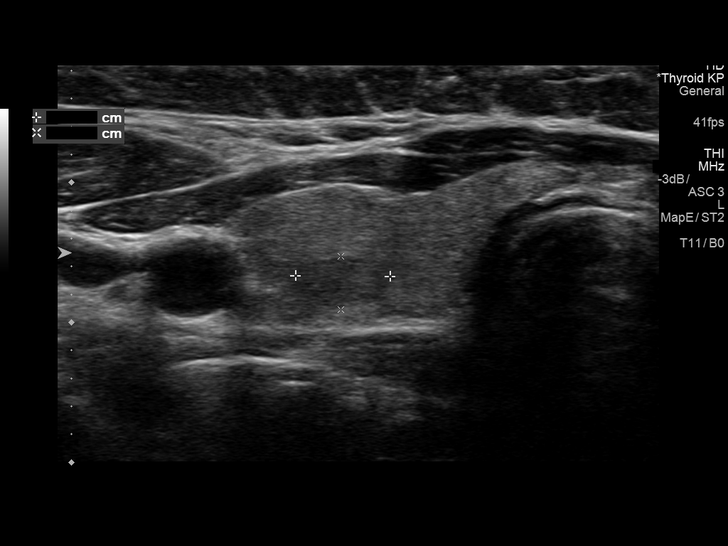
[im 12/46]
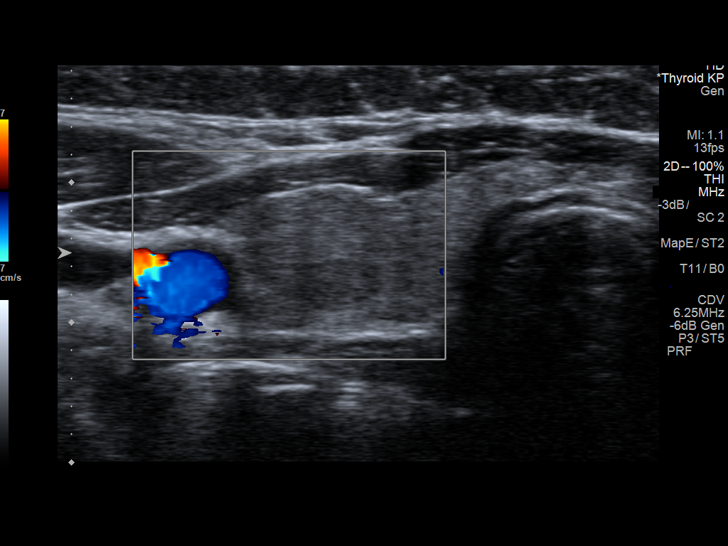
[im 16/46]
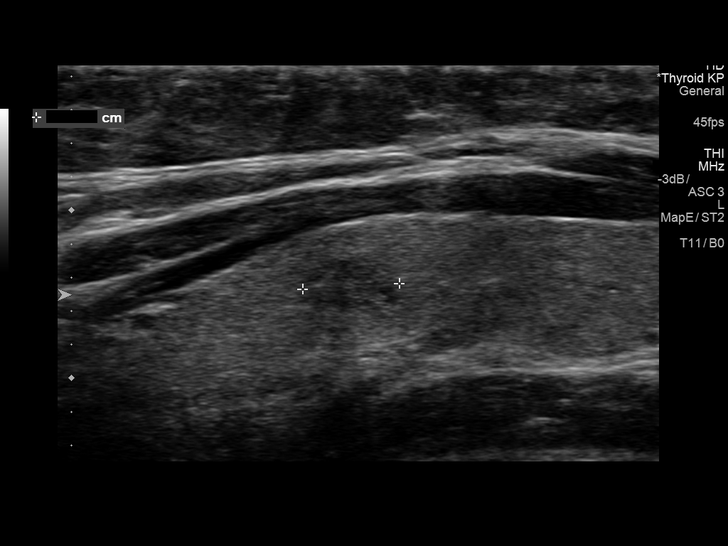
[im 19/46]
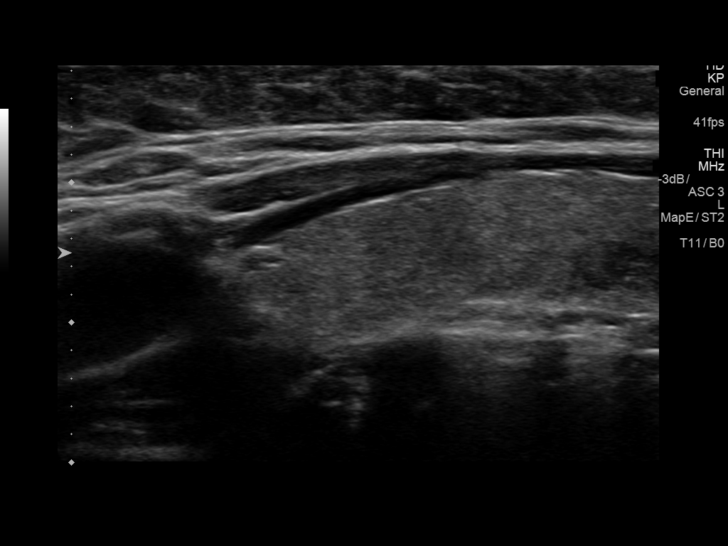
[im 23/46]
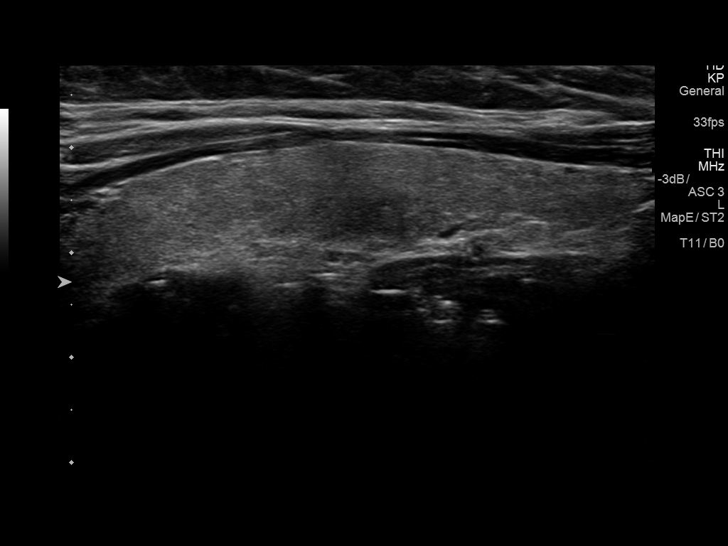
[im 27/46]
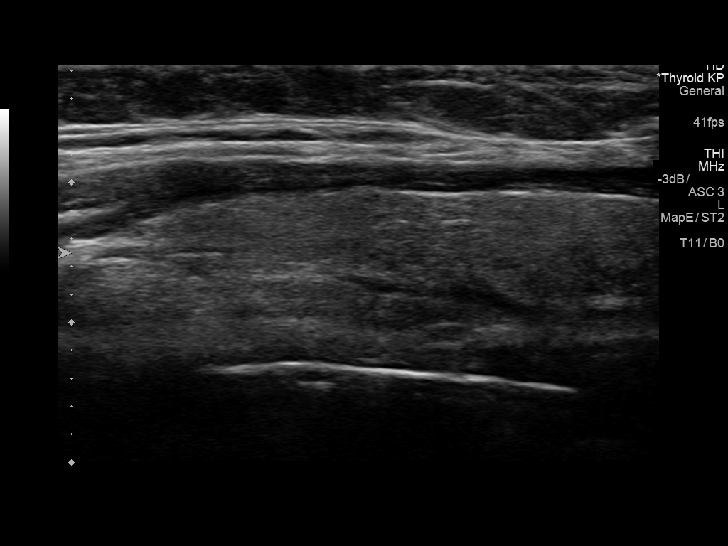
[im 31/46]
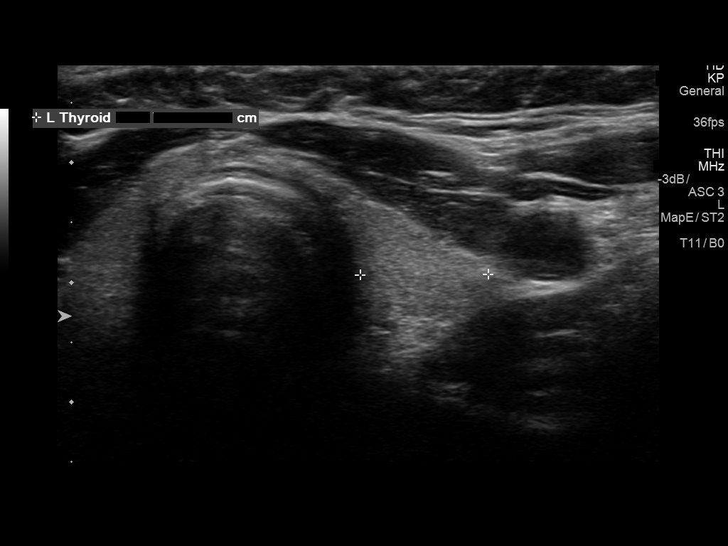
[im 34/46]
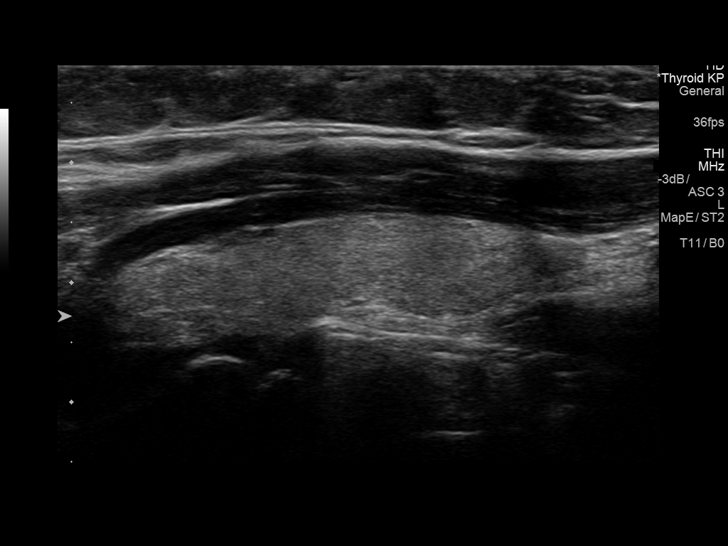
[im 38/46]
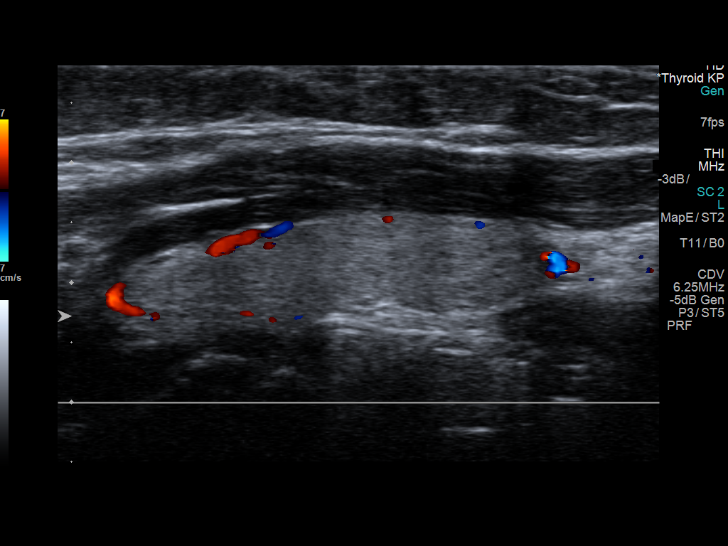
[im 42/46]
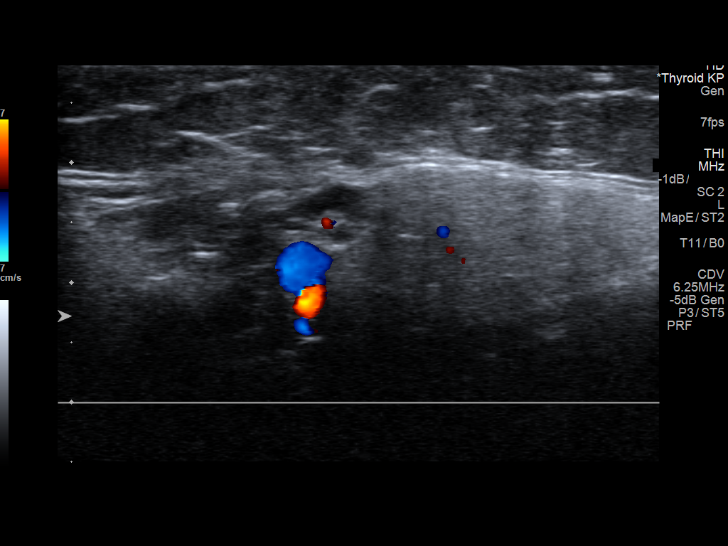
[im 46/46]
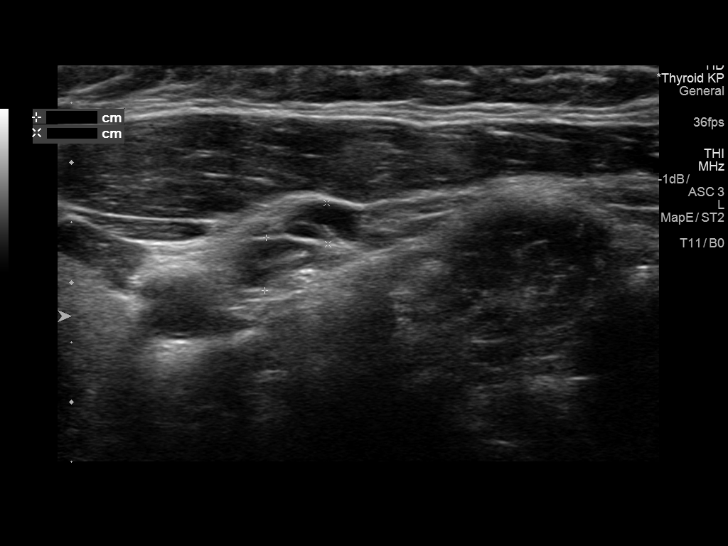

[13 of 25 positions shown; findings below may reference images not displayed]

FINDINGS: Parenchymal Echotexture: Mildly heterogenous

Isthmus: 0.2 cm

Right lobe: 5.4 cm x 1.3 cm x 1.8 cm

Left lobe: 4.0 cm x 0.9 cm x 1.1 cm

_________________________________________________________

Estimated total number of nodules >/= 1 cm: 0

Number of spongiform nodules >/=  2 cm not described below (TR1): 0

Number of mixed cystic and solid nodules >/= 1.5 cm not described
below (TR2): 0

_________________________________________________________

Nodule # 1:

Location: Right; Mid

Maximum size: 0.6 cm; Other 2 dimensions: 0.3 cm x 0.5 cm

Composition: solid/almost completely solid (2)

Echogenicity: isoechoic (1)

Shape: not taller-than-wide (0)

Margins: ill-defined (0)

Echogenic foci: none (0)

ACR TI-RADS total points: 3.

ACR TI-RADS risk category: TR3 (3 points).

ACR TI-RADS recommendations:

Nodule does not meet criteria for surveillance or biopsy

_________________________________________________________

Nodule # 2:

Location: Right; Mid

Maximum size: 0.7 cm; Other 2 dimensions: 0.4 cm x 0.7 cm

Composition: solid/almost completely solid (2)

Echogenicity: isoechoic (1)

Shape: not taller-than-wide (0)

Margins: ill-defined (0)

Echogenic foci: none (0)

ACR TI-RADS total points: 3.

ACR TI-RADS risk category: TR3 (3 points).

ACR TI-RADS recommendations:

Nodule does not meet criteria for surveillance or biopsy

_________________________________________________________

Unremarkable lymph nodes identified.
IMPRESSION: No thyroid nodule meets criteria for biopsy or surveillance, as
designated by the newly established ACR TI-RADS criteria.

Recommendations follow those established by the new ACR TI-RADS
criteria ([HOSPITAL] 0539;[DATE]).

## 2018-04-27 ENCOUNTER — Other Ambulatory Visit: Payer: Self-pay | Admitting: Podiatry

## 2018-04-27 IMAGING — US US ABDOMEN COMPLETE
1 series · 14 of 25 positions shown · non-contrast
Comparison: Ultrasound of the abdomen of 02/13/2016

CLINICAL DATA: Epigastric pain over the last 2 years

EXAM:
ABDOMEN ULTRASOUND COMPLETE

[Series 1: us abdomen complete · 0.18mm/px · 14 of 90 slices shown]
[im 1/90]
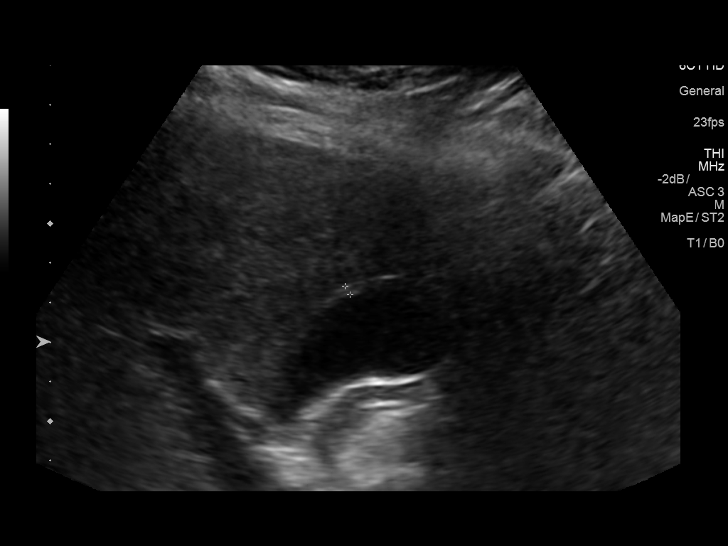
[im 8/90]
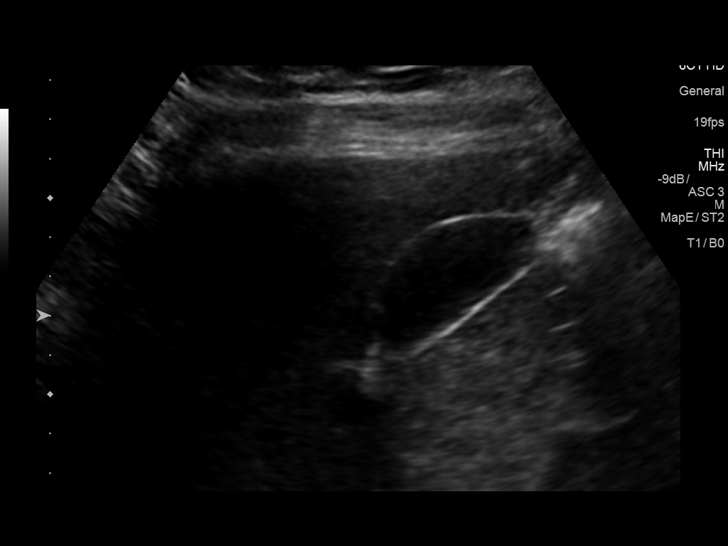
[im 15/90]
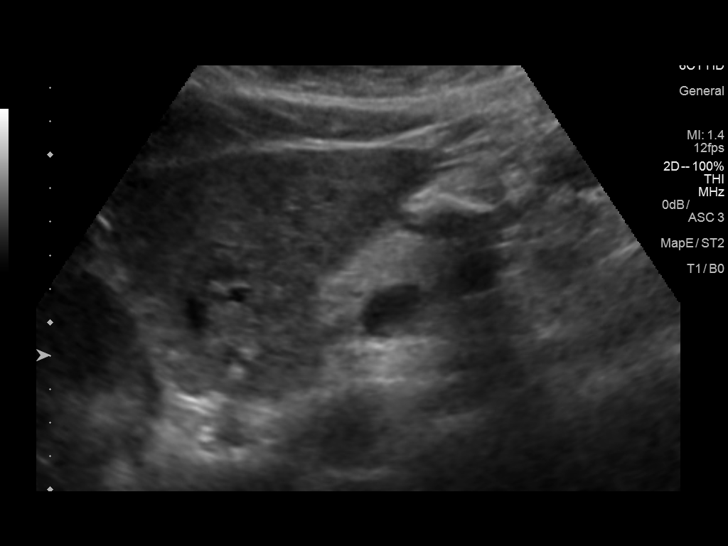
[im 23/90]
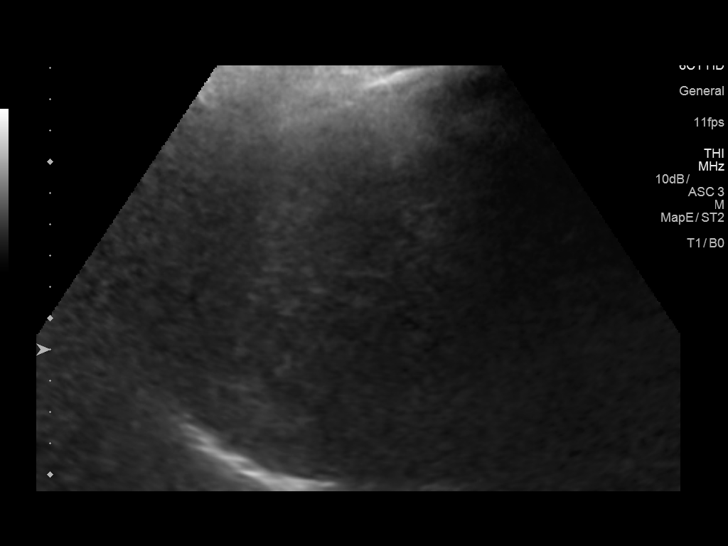
[im 30/90]
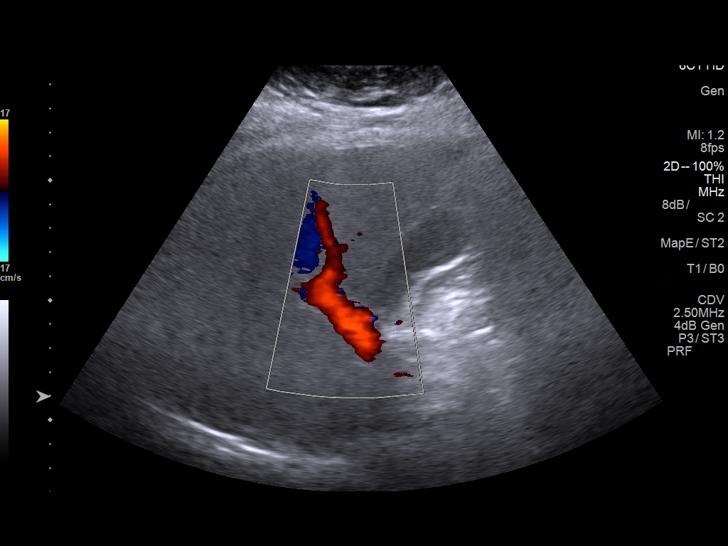
[im 34/90]
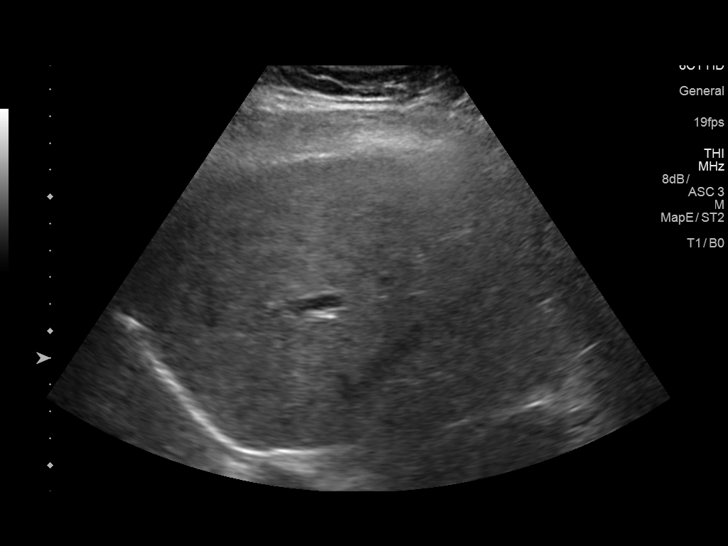
[im 41/90]
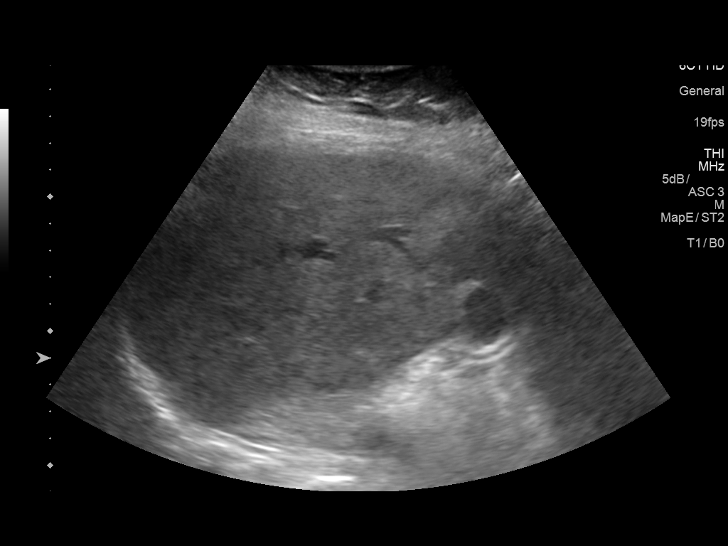
[im 49/90]
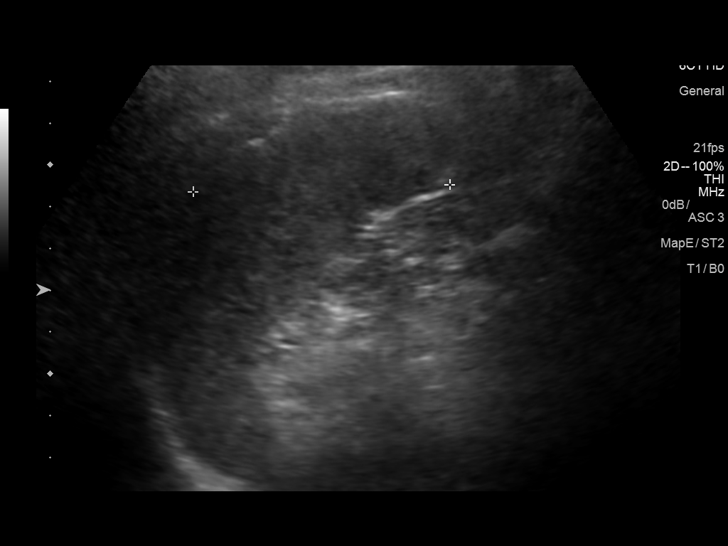
[im 56/90]
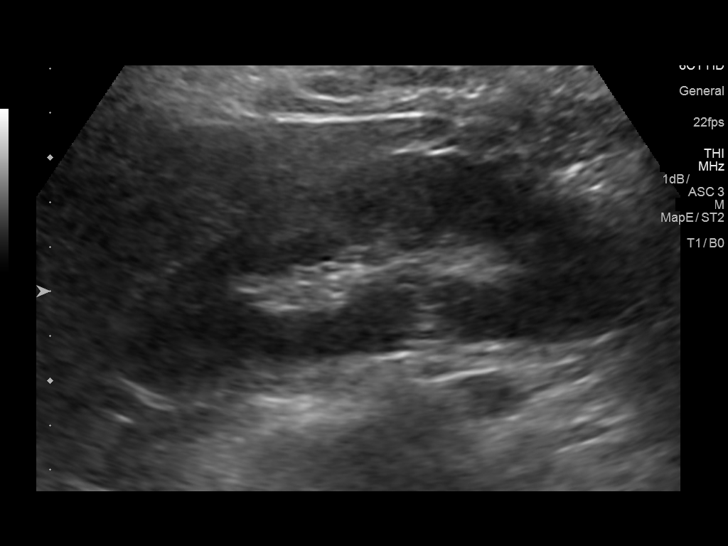
[im 60/90]
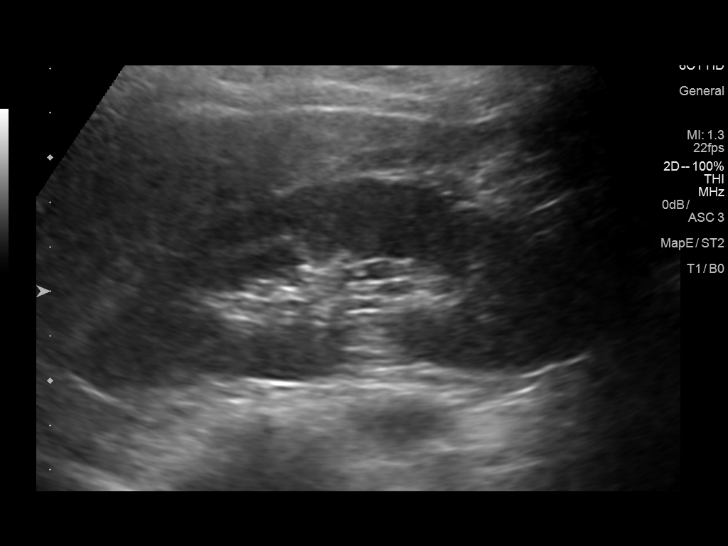
[im 67/90]
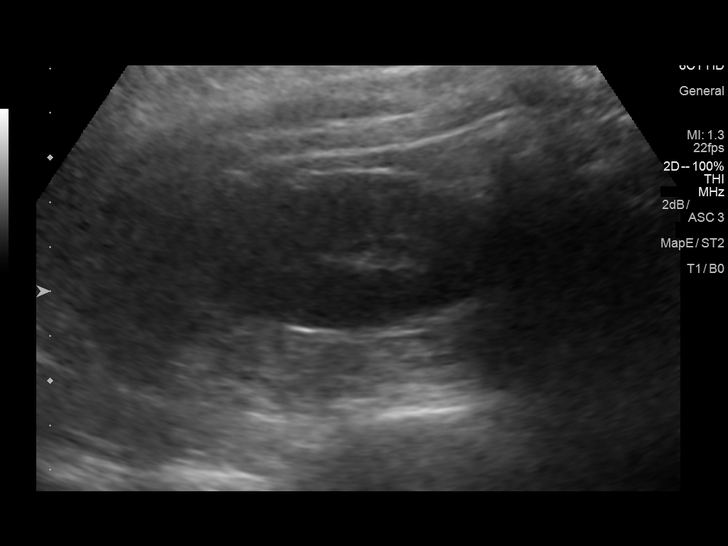
[im 75/90]
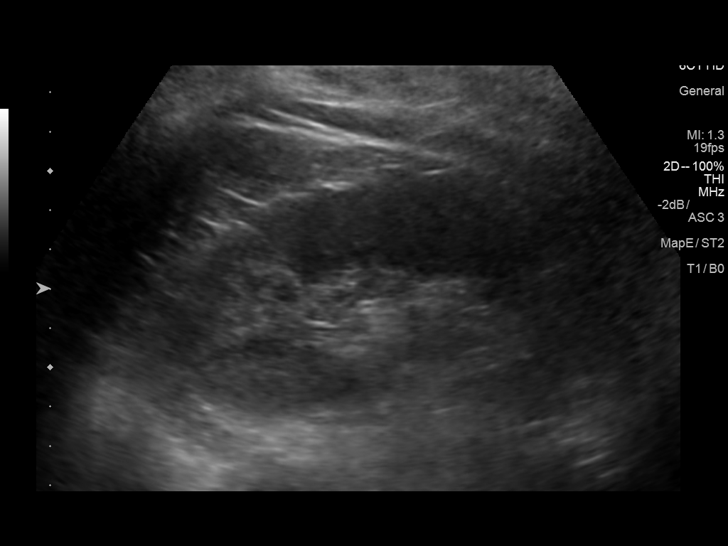
[im 82/90]
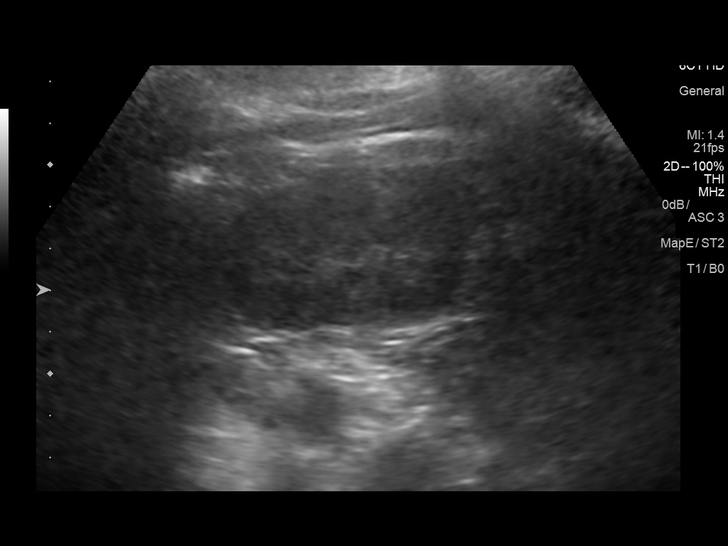
[im 90/90]
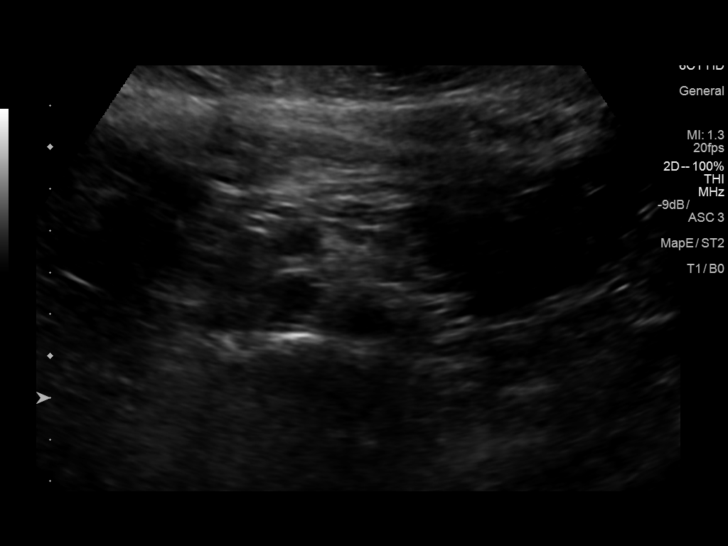

[14 of 25 positions shown; findings below may reference images not displayed]

FINDINGS: Gallbladder: The gallbladder is visualized and no gallstones are
noted. There is no pain over the gallbladder with compression.

Common bile duct: Diameter: The common bile duct is normal measuring
4.3 mm in diameter.

Liver: The parenchyma of the liver is somewhat echogenic consistent
with fatty infiltration. No focal hepatic abnormality is seen.

IVC:  No abnormality is seen.

Pancreas: Only the distal tail of the pancreas is obscured by bowel
gas. The pancreatic duct is not dilated.

Spleen: The spleen measures 6.1 cm.

Right Kidney: Length: 11.7 cm..  No hydronephrosis is seen.

Left Kidney: Length: 12.0 cm..  No hydronephrosis is noted.

Abdominal aorta: The abdominal aorta is normal caliber.

Other findings: None.
IMPRESSION: 1. No gallstones.
2. Echogenic liver parenchyma consistent with fatty infiltration.
3. The tail of the pancreas is obscured by bowel gas.

## 2018-10-27 ENCOUNTER — Encounter (HOSPITAL_COMMUNITY): Payer: Self-pay

## 2019-03-05 LAB — OB RESULTS CONSOLE HIV ANTIBODY (ROUTINE TESTING): HIV: NONREACTIVE

## 2019-03-05 LAB — OB RESULTS CONSOLE RPR: RPR: NONREACTIVE

## 2019-03-05 LAB — OB RESULTS CONSOLE RUBELLA ANTIBODY, IGM: Rubella: IMMUNE

## 2019-03-05 LAB — OB RESULTS CONSOLE GC/CHLAMYDIA
Chlamydia: NEGATIVE
Gonorrhea: NEGATIVE

## 2019-03-05 LAB — OB RESULTS CONSOLE HEPATITIS B SURFACE ANTIGEN: Hepatitis B Surface Ag: NEGATIVE

## 2019-06-30 LAB — OB RESULTS CONSOLE RPR: RPR: NONREACTIVE

## 2019-08-27 NOTE — L&D Delivery Note (Signed)
Delivery Note Patient pushed well for 2 hours 15 minutes without epidural.  At 11:06 PM a viable female was delivered via Vaginal, Spontaneous (Presentation:  Direct Occiput Posterior).  APGAR: 9, 9; weight 3391 gm (7 lb 7.6 oz) .   Placenta status: Spontaneous, Intact.  Cord: 3 vessels with the following complications: None.  Cord pH: n/a  Anesthesia: Local Episiotomy: None Lacerations: 2nd degree;Perineal Suture Repair: 2.0 vicryl rapide Est. Blood Loss (mL): 273  Mom to postpartum.  Baby to Couplet care / Skin to Skin.  [place] 09/16/2019, 11:44 PM

## 2019-09-06 LAB — OB RESULTS CONSOLE GBS: GBS: NEGATIVE

## 2019-09-16 ENCOUNTER — Other Ambulatory Visit: Payer: Self-pay

## 2019-09-16 ENCOUNTER — Inpatient Hospital Stay (HOSPITAL_COMMUNITY)
Admission: AD | Admit: 2019-09-16 | Discharge: 2019-09-18 | DRG: 806 | Disposition: A | Discharge status: Home / Self Care | Payer: BC Managed Care – PPO | Attending: Obstetrics | Admitting: Obstetrics

## 2019-09-16 ENCOUNTER — Encounter (HOSPITAL_COMMUNITY): Payer: Self-pay | Admitting: *Deleted

## 2019-09-16 DIAGNOSIS — Z3A37 37 weeks gestation of pregnancy: Secondary | ICD-10-CM

## 2019-09-16 DIAGNOSIS — Z85850 Personal history of malignant neoplasm of thyroid: Secondary | ICD-10-CM

## 2019-09-16 DIAGNOSIS — A6000 Herpesviral infection of urogenital system, unspecified: Secondary | ICD-10-CM | POA: Diagnosis present

## 2019-09-16 DIAGNOSIS — Z794 Long term (current) use of insulin: Secondary | ICD-10-CM

## 2019-09-16 DIAGNOSIS — O4292 Full-term premature rupture of membranes, unspecified as to length of time between rupture and onset of labor: Secondary | ICD-10-CM | POA: Diagnosis present

## 2019-09-16 DIAGNOSIS — O2412 Pre-existing diabetes mellitus, type 2, in childbirth: Secondary | ICD-10-CM | POA: Diagnosis present

## 2019-09-16 DIAGNOSIS — O9832 Other infections with a predominantly sexual mode of transmission complicating childbirth: Secondary | ICD-10-CM | POA: Diagnosis present

## 2019-09-16 DIAGNOSIS — O26893 Other specified pregnancy related conditions, third trimester: Secondary | ICD-10-CM | POA: Diagnosis present

## 2019-09-16 DIAGNOSIS — Z20822 Contact with and (suspected) exposure to covid-19: Secondary | ICD-10-CM | POA: Diagnosis present

## 2019-09-16 DIAGNOSIS — E119 Type 2 diabetes mellitus without complications: Secondary | ICD-10-CM | POA: Diagnosis present

## 2019-09-16 HISTORY — DX: Thyrotoxicosis, unspecified without thyrotoxic crisis or storm: E05.90

## 2019-09-16 HISTORY — DX: Malignant (primary) neoplasm, unspecified: C80.1

## 2019-09-16 LAB — COMPREHENSIVE METABOLIC PANEL
ALT: 71 U/L — ABNORMAL HIGH (ref 0–44)
ALT: 76 U/L — ABNORMAL HIGH (ref 0–44)
ALT: 76 U/L — ABNORMAL HIGH (ref 0–44)
AST: 79 U/L — ABNORMAL HIGH (ref 15–41)
AST: 79 U/L — ABNORMAL HIGH (ref 15–41)
AST: 75 U/L — ABNORMAL HIGH (ref 15–41)
Albumin: 2.9 g/dL — ABNORMAL LOW (ref 3.5–5.0)
Albumin: 2.8 g/dL — ABNORMAL LOW (ref 3.5–5.0)
Albumin: 2.8 g/dL — ABNORMAL LOW (ref 3.5–5.0)
Alkaline Phosphatase: 98 U/L (ref 38–126)
Alkaline Phosphatase: 92 U/L (ref 38–126)
Alkaline Phosphatase: 93 U/L (ref 38–126)
Anion gap: 10 (ref 5–15)
Anion gap: 10 (ref 5–15)
Anion gap: 9 (ref 5–15)
BUN: 10 mg/dL (ref 6–20)
BUN: 9 mg/dL (ref 6–20)
BUN: 8 mg/dL (ref 6–20)
CO2: 22 mmol/L (ref 22–32)
CO2: 22 mmol/L (ref 22–32)
CO2: 19 mmol/L — ABNORMAL LOW (ref 22–32)
Calcium: 8.8 mg/dL — ABNORMAL LOW (ref 8.9–10.3)
Calcium: 8.7 mg/dL — ABNORMAL LOW (ref 8.9–10.3)
Calcium: 8.2 mg/dL — ABNORMAL LOW (ref 8.9–10.3)
Chloride: 105 mmol/L (ref 98–111)
Chloride: 102 mmol/L (ref 98–111)
Chloride: 104 mmol/L (ref 98–111)
Creatinine, Ser: 0.78 mg/dL (ref 0.44–1.00)
Creatinine, Ser: 0.75 mg/dL (ref 0.44–1.00)
Creatinine, Ser: 0.76 mg/dL (ref 0.44–1.00)
GFR calc Af Amer: >60 mL/min (ref >60)
GFR calc Af Amer: >60 mL/min (ref >60)
GFR calc Af Amer: >60 mL/min (ref >60)
GFR calc non Af Amer: >60 mL/min (ref >60)
GFR calc non Af Amer: >60 mL/min (ref >60)
GFR calc non Af Amer: >60 mL/min (ref >60)
Glucose, Bld: 89 mg/dL (ref 70–99)
Glucose, Bld: 88 mg/dL (ref 70–99)
Glucose, Bld: 97 mg/dL (ref 70–99)
Potassium: 4.1 mmol/L (ref 3.5–5.1)
Potassium: 4 mmol/L (ref 3.5–5.1)
Potassium: 3.6 mmol/L (ref 3.5–5.1)
Sodium: 137 mmol/L (ref 135–145)
Sodium: 134 mmol/L — ABNORMAL LOW (ref 135–145)
Sodium: 132 mmol/L — ABNORMAL LOW (ref 135–145)
Total Bilirubin: 0.4 mg/dL (ref 0.3–1.2)
Total Bilirubin: 0.8 mg/dL (ref 0.3–1.2)
Total Bilirubin: 0.9 mg/dL (ref 0.3–1.2)
Total Protein: 6.4 g/dL — ABNORMAL LOW (ref 6.5–8.1)
Total Protein: 6 g/dL — ABNORMAL LOW (ref 6.5–8.1)
Total Protein: 5.8 g/dL — ABNORMAL LOW (ref 6.5–8.1)

## 2019-09-16 LAB — CBC
HCT: 38.3 % (ref 36.0–46.0)
HCT: 35.3 % — ABNORMAL LOW (ref 36.0–46.0)
HCT: 33.8 % — ABNORMAL LOW (ref 36.0–46.0)
Hemoglobin: 12 g/dL (ref 12.0–15.0)
Hemoglobin: 11.4 g/dL — ABNORMAL LOW (ref 12.0–15.0)
Hemoglobin: 11.3 g/dL — ABNORMAL LOW (ref 12.0–15.0)
MCH: 28 pg (ref 26.0–34.0)
MCH: 28.2 pg (ref 26.0–34.0)
MCH: 28.3 pg (ref 26.0–34.0)
MCHC: 31.3 g/dL (ref 30.0–36.0)
MCHC: 32.3 g/dL (ref 30.0–36.0)
MCHC: 33.4 g/dL (ref 30.0–36.0)
MCV: 89.3 fL (ref 80.0–100.0)
MCV: 87.4 fL (ref 80.0–100.0)
MCV: 84.7 fL (ref 80.0–100.0)
Platelets: 131 10*3/uL — ABNORMAL LOW (ref 150–400)
Platelets: 112 10*3/uL — ABNORMAL LOW (ref 150–400)
Platelets: 99 10*3/uL — ABNORMAL LOW (ref 150–400)
RBC: 4.29 MIL/uL (ref 3.87–5.11)
RBC: 4.04 MIL/uL (ref 3.87–5.11)
RBC: 3.99 MIL/uL (ref 3.87–5.11)
RDW: 14.9 % (ref 11.5–15.5)
RDW: 14.7 % (ref 11.5–15.5)
RDW: 14.8 % (ref 11.5–15.5)
WBC: 16.6 10*3/uL — ABNORMAL HIGH (ref 4.0–10.5)
WBC: 12.4 10*3/uL — ABNORMAL HIGH (ref 4.0–10.5)
WBC: 15.9 10*3/uL — ABNORMAL HIGH (ref 4.0–10.5)
nRBC: 0 % (ref 0.0–0.2)
nRBC: 0 % (ref 0.0–0.2)
nRBC: 0 % (ref 0.0–0.2)

## 2019-09-16 LAB — TYPE AND SCREEN
ABO/RH(D): A POS
Antibody Screen: NEGATIVE

## 2019-09-16 LAB — PROTEIN / CREATININE RATIO, URINE
Creatinine, Urine: 161.29 mg/dL
Protein Creatinine Ratio: 0.15 mg/mg{Cre} (ref 0.00–0.15)
Total Protein, Urine: 24 mg/dL

## 2019-09-16 LAB — URINALYSIS, ROUTINE W REFLEX MICROSCOPIC
Bacteria, UA: NONE SEEN
Bilirubin Urine: NEGATIVE
Glucose, UA: NEGATIVE mg/dL
Ketones, ur: 20 mg/dL — AB
Leukocytes,Ua: NEGATIVE
Nitrite: NEGATIVE
Protein, ur: 30 mg/dL — AB
RBC / HPF: >50 RBC/hpf — ABNORMAL HIGH (ref 0–5)
Specific Gravity, Urine: 1.02 (ref 1.005–1.030)
pH: 6 (ref 5.0–8.0)

## 2019-09-16 LAB — RPR: RPR Ser Ql: NONREACTIVE

## 2019-09-16 LAB — GLUCOSE, CAPILLARY
Glucose-Capillary: 75 mg/dL (ref 70–99)
Glucose-Capillary: 68 mg/dL — ABNORMAL LOW (ref 70–99)
Glucose-Capillary: 85 mg/dL (ref 70–99)
Glucose-Capillary: 87 mg/dL (ref 70–99)
Glucose-Capillary: 148 mg/dL — ABNORMAL HIGH (ref 70–99)

## 2019-09-16 LAB — SARS CORONAVIRUS 2 (TAT 6-24 HRS): SARS Coronavirus 2: NEGATIVE

## 2019-09-16 LAB — POCT FERN TEST: POCT Fern Test: POSITIVE

## 2019-09-16 LAB — ABO/RH: ABO/RH(D): A POS

## 2019-09-16 MED ORDER — ONDANSETRON HCL 4 MG/2ML IJ SOLN: 4.0000 mg | Freq: Four times a day (QID) | INTRAMUSCULAR | Status: DC | PRN

## 2019-09-16 MED ORDER — LACTATED RINGERS IV SOLN: INTRAVENOUS | Status: DC

## 2019-09-16 MED ORDER — OXYTOCIN BOLUS FROM INFUSION
500.0000 mL | Freq: Once | INTRAVENOUS | Status: AC
  Administered 2019-09-16: 500 mL via INTRAVENOUS

## 2019-09-16 MED ORDER — LIDOCAINE HCL (PF) 1 % IJ SOLN
30.0000 mL | INTRAMUSCULAR | Status: DC | PRN
  Administered 2019-09-16: 30 mL via SUBCUTANEOUS
  Filled 2019-09-16: qty 30

## 2019-09-16 MED ORDER — TERBUTALINE SULFATE 1 MG/ML IJ SOLN: 0.2500 mg | Freq: Once | INTRAMUSCULAR | Status: DC | PRN

## 2019-09-16 MED ORDER — FLEET ENEMA 7-19 GM/118ML RE ENEM: 1.0000 | ENEMA | RECTAL | Status: DC | PRN

## 2019-09-16 MED ORDER — OXYTOCIN 40 UNITS IN NORMAL SALINE INFUSION - SIMPLE MED
2.5000 [IU]/h | INTRAVENOUS | Status: DC
  Filled 2019-09-16: qty 1000

## 2019-09-16 MED ORDER — ACETAMINOPHEN 325 MG PO TABS: 650.0000 mg | ORAL_TABLET | ORAL | Status: DC | PRN

## 2019-09-16 MED ORDER — OXYTOCIN 40 UNITS IN NORMAL SALINE INFUSION - SIMPLE MED
1.0000 m[IU]/min | INTRAVENOUS | Status: DC
  Administered 2019-09-16: 15:00:00 2 m[IU]/min via INTRAVENOUS

## 2019-09-16 MED ORDER — LACTATED RINGERS IV SOLN: 500.0000 mL | INTRAVENOUS | Status: DC | PRN

## 2019-09-16 MED ORDER — OXYCODONE-ACETAMINOPHEN 5-325 MG PO TABS: 2.0000 | ORAL_TABLET | ORAL | Status: DC | PRN

## 2019-09-16 MED ORDER — OXYCODONE-ACETAMINOPHEN 5-325 MG PO TABS: 1.0000 | ORAL_TABLET | ORAL | Status: DC | PRN

## 2019-09-16 MED ORDER — SOD CITRATE-CITRIC ACID 500-334 MG/5ML PO SOLN: 30.0000 mL | ORAL | Status: DC | PRN

## 2019-09-16 NOTE — Progress Notes (Signed)
Patient seen and examined.  She is resting comfortably.  Contractions are infrequent and mild.    BP 130/67   Pulse 70   Temp 98.7 F (37.1 C) (Oral)   Resp 16   Ht 5\' 6"  (1.676 m)   Wt 103.4 kg   BMI 36.80 kg/m   Toco: quiet EFM: 140s, moderate variability, Category 1 SVE: patient declined  A/P: [id number] @ [redacted]w[redacted]d w PROM 1.  Latent labor.  Contractions are infrequent, palpate mild and patient is comfortable.  I recommend cervical check and / or consideration of starting augmentation.  Patient declines both at this time.  We did discuss increased risk of chorioamnionitis with prolonged rupture of membranes.   2.  DM2: on metformin / SSI at baseline.  Most recent growth [place] on 1/14 was 6lb 15oz (79%).  q4h BG 3. Elevated BPs on admission.  Have been trending up in office.  AST/ALT elevated, but <2x upper limit of normal.  Platelets mildly decreased at 131.  Repeat labs at 11AM.  Will monitor closely for s/sx of HELLP

## 2019-09-16 NOTE — Progress Notes (Signed)
Patient seen and examined.  She had mild contractions this AM that have since tapered off.  She is not uncomfortable at this time.  Reviewed labs with patient.  AST / ALT essentially stable just below 2x upper limit of normal.  Platelets have declined from 131 to 112.   BPs continue to be labile, but mild 120-140/70-80s.  Patient would like to continue expectant management or maybe try nipple stim, which I had offered this morning.  I discussed with her my concerns that her mild abnormalities in LFTs / platelets could be indicative of impending HELLP, though she does not meet criteria for diagnosis at this time.  My concern would be sudden acute worsening of symptoms / labs if she remains undelivered.  I strongly recommend proceeding with augmentation of labor.  We discussed potential risks of severe preeclampsia and HELLP.  She agrees to pitocin, though declines cervical exam at this time.  Vertex presentation confirmed by BSUS.  Will repeat labs in 8 hours from last.  EFM remains category 1

## 2019-09-16 NOTE — H&P (Signed)
Natalie Ramirez is a 44 y.o. female G33P0 [redacted]w[redacted]d presenting for concern for rupture of membranes. She reports bad heartburn tonight, which correlated with contractions on the monitor. She denies VB. Reports normal FM.  Pregnancy complicated by: 1. DM2: on metformin and insulin, insulin started during pregnancy, managed by Endocrine, currently being weaned off insulin. Last A1c 5.8% 12/30. EFW 69.7% AC, 70.4% on 08/16/2019 2. AMA: conceived naturally, MaterniT21 low risk. 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 H/o thyroidectomy 2/2 thyroid cancer, now on synthroid  5. 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 Recs: hypotension may be greatest risk for potential visual event, no evidence for CS vs. Vaginal del. Recommend monitoring BP closely.  OB History    Gravida  2   Para      Term      Preterm      AB      Living        SAB      TAB      Ectopic      Multiple      Live Births             Past Medical History:  Diagnosis Date  . Diabetes mellitus without complication [facility])    Past Surgical History:  Procedure Laterality Date  . ABCESS DRAINAGE    . CERVICAL FUSION    . THYROIDECTOMY, PARTIAL    . WISDOM TOOTH EXTRACTION     Family History: family history includes Autoimmune disease in her mother; Obesity in her mother. Social History:  reports that she has never smoked. She has never used smokeless tobacco. She reports previous alcohol use. She reports previous drug use.     Maternal Diabetes: Yes:  Diabetes Type:  Pre-pregnancy Genetic Screening: Normal Maternal Ultrasounds/Referrals: Normal Anterior placenta Fetal Ultrasounds or other Referrals:  None Maternal Substance Abuse:  No Significant Maternal Medications:  Meds include: Syntroid Significant Maternal Lab Results:  Group B Strep negative Other Comments:  None  Review of Systems Per HPI Exam Physical Exam  Dilation: 4.5 Effacement (%):  80 Station: -1 Exam by:: benji stanley RN Vitals:   09/16/19 0438 09/16/19 0448 09/16/19 0531 09/16/19 0645  BP: (!) 155/88  (!) 134/96 126/89  Pulse: 66  83 99  Resp: 19  18 19   Temp:  98.4 F (36.9 C)  (!) 97.4 F (36.3 C)  TempSrc:  Oral  Axillary  Weight:      Height:        NAD, resting comfortably, painful with contractions Gravid abdomen Fetal testing: FHR 130, +accels, no decels. Cat 1 Toco q21m   Prenatal labs: ABO, Rh:  --/--/A POS, A POS Performed at [facility] [address], [place], [place] [address]  [phone]/21 0400) Antibody: NEG (01/21 0400) Rubella: Immune (07/10 0000) RPR: Nonreactive (11/04 0000)  HBsAg: Negative (07/10 0000)  HIV: Non-reactive (07/10 0000)  GBS: Negative/-- (01/11 0000)   Recent Labs    09/16/19 0347 09/16/19 0515  WBC 16.6*  --   HGB 12.0  --   HCT 38.3  --   PLT 131*  --   NA  --  137  K  --  4.1  CL  --  105  BUN  --  10  CREATININE  --  0.78  AST  --  79*  ALT  --  71*  BILITOT  --  0.4   Assessment/Plan: Earley Favor  44 y.o. G2P0 at [redacted]w[redacted]d with SROM 1. SROM: at 0100, SVE on admit was 4.5/80/-1. Recommend augment with pitocin prn. Patient hesitant for additional cervical changes and desires natural, undedicated labor. Discussed recommendation for monitoring cervical change to augment with pitocin prn. Patient also requested no pitocin postpartum, discussed recommendation for pitocin to prevent PPH. Patient to discuss further with her husband.  2. Elevated BP on admit: No history of HTN. Bps have been increasing in clinic but not mild range yet. Asymptomatic for preeclampsia, UPC 0.15, CBC notable for plt 131 (previously 225) and elevated AST and ALT (not 2x upper limit). Will recheck labs in 6 hours.  3. DM2: Plan to check BG intrapartum, insulin prn 4. HSV: on suppression  5. H/o thyroidectomy 2/2 thyroid cancer, now on synthroid  6. Drusen of optic disc: Recommend monitoring BP closely.  Fontaine Kossman K  Taam-Akelman 09/16/2019, 6:36 AM

## 2019-09-16 NOTE — Progress Notes (Signed)
Pt. Sitting on side of the bed-unmedicated labor. RN remains at bedside attempting to monitor baby. Positioning making it difficult. Will continue to adjust U/S. Provider en route.

## 2019-09-16 NOTE — Progress Notes (Signed)
Repeat labs just prior to initiation of pushing returned with platelets of 99.  AST/ALT stable at 75/76.  Creatinine remains normal at 0.76.  Will continue to monitor for progression, but do not feel that this is consistent with HELLP at this time as only platelets meet criteria.  Will repeat labs in AM

## 2019-09-16 NOTE — MAU Note (Signed)
SROM at 0100. Clear fld and then noted some blood in it. Has not felt baby move since SROM. 2-3cm last sve. Has felt some pressure/tightness upper abd that is unsure if is ctxs or heartburn or what. Comes and goes.

## 2019-09-16 NOTE — MAU Note (Signed)
covid swab obtained without difficulty and pt tol well. No symptoms °

## 2019-09-17 ENCOUNTER — Encounter (HOSPITAL_COMMUNITY): Payer: Self-pay | Admitting: Obstetrics & Gynecology

## 2019-09-17 LAB — CBC
HCT: 30.9 % — ABNORMAL LOW (ref 36.0–46.0)
Hemoglobin: 10.1 g/dL — ABNORMAL LOW (ref 12.0–15.0)
MCH: 28.3 pg (ref 26.0–34.0)
MCHC: 32.7 g/dL (ref 30.0–36.0)
MCV: 86.6 fL (ref 80.0–100.0)
Platelets: 106 10*3/uL — ABNORMAL LOW (ref 150–400)
RBC: 3.57 MIL/uL — ABNORMAL LOW (ref 3.87–5.11)
RDW: 14.8 % (ref 11.5–15.5)
WBC: 15.2 10*3/uL — ABNORMAL HIGH (ref 4.0–10.5)
nRBC: 0 % (ref 0.0–0.2)

## 2019-09-17 LAB — COMPREHENSIVE METABOLIC PANEL
ALT: 56 U/L — ABNORMAL HIGH (ref 0–44)
AST: 44 U/L — ABNORMAL HIGH (ref 15–41)
Albumin: 2.3 g/dL — ABNORMAL LOW (ref 3.5–5.0)
Alkaline Phosphatase: 83 U/L (ref 38–126)
Anion gap: 7 (ref 5–15)
BUN: 9 mg/dL (ref 6–20)
CO2: 23 mmol/L (ref 22–32)
Calcium: 8 mg/dL — ABNORMAL LOW (ref 8.9–10.3)
Chloride: 106 mmol/L (ref 98–111)
Creatinine, Ser: 0.82 mg/dL (ref 0.44–1.00)
GFR calc Af Amer: >60 mL/min (ref >60)
GFR calc non Af Amer: >60 mL/min (ref >60)
Glucose, Bld: 150 mg/dL — ABNORMAL HIGH (ref 70–99)
Potassium: 4.3 mmol/L (ref 3.5–5.1)
Sodium: 136 mmol/L (ref 135–145)
Total Bilirubin: 0.2 mg/dL — ABNORMAL LOW (ref 0.3–1.2)
Total Protein: 5.2 g/dL — ABNORMAL LOW (ref 6.5–8.1)

## 2019-09-17 LAB — GLUCOSE, CAPILLARY
Glucose-Capillary: 94 mg/dL (ref 70–99)
Glucose-Capillary: 109 mg/dL — ABNORMAL HIGH (ref 70–99)
Glucose-Capillary: 107 mg/dL — ABNORMAL HIGH (ref 70–99)

## 2019-09-17 MED ORDER — LEVOTHYROXINE SODIUM 100 MCG PO TABS
100.0000 ug | ORAL_TABLET | Freq: Every day | ORAL | Status: DC
  Administered 2019-09-17 – 2019-09-18 (×2): 100 ug via ORAL
  Filled 2019-09-17 (×2): qty 1

## 2019-09-17 MED ORDER — METFORMIN HCL ER 750 MG PO TB24
2000.0000 mg | ORAL_TABLET | Freq: Every day | ORAL | Status: DC
  Administered 2019-09-17 – 2019-09-18 (×2): 2000 mg via ORAL
  Filled 2019-09-17 (×2): qty 1

## 2019-09-17 MED ORDER — WITCH HAZEL-GLYCERIN EX PADS: 1.0000 "application " | MEDICATED_PAD | CUTANEOUS | Status: DC | PRN

## 2019-09-17 MED ORDER — BENZOCAINE-MENTHOL 20-0.5 % EX AERO
1.0000 "application " | INHALATION_SPRAY | CUTANEOUS | Status: DC | PRN
  Administered 2019-09-17 – 2019-09-18 (×2): 1 via TOPICAL
  Filled 2019-09-17 (×2): qty 56

## 2019-09-17 MED ORDER — DIBUCAINE (PERIANAL) 1 % EX OINT: 1.0000 "application " | TOPICAL_OINTMENT | CUTANEOUS | Status: DC | PRN

## 2019-09-17 MED ORDER — SIMETHICONE 80 MG PO CHEW: 80.0000 mg | CHEWABLE_TABLET | ORAL | Status: DC | PRN

## 2019-09-17 MED ORDER — DIPHENHYDRAMINE HCL 25 MG PO CAPS: 25.0000 mg | ORAL_CAPSULE | Freq: Four times a day (QID) | ORAL | Status: DC | PRN

## 2019-09-17 MED ORDER — TETANUS-DIPHTH-ACELL PERTUSSIS 5-2.5-18.5 LF-MCG/0.5 IM SUSP: 0.5000 mL | Freq: Once | INTRAMUSCULAR | Status: DC

## 2019-09-17 MED ORDER — FAMOTIDINE 20 MG PO TABS: 20.0000 mg | ORAL_TABLET | Freq: Two times a day (BID) | ORAL | Status: DC | PRN

## 2019-09-17 MED ORDER — SENNOSIDES-DOCUSATE SODIUM 8.6-50 MG PO TABS
2.0000 | ORAL_TABLET | ORAL | Status: DC
  Administered 2019-09-17: 02:00:00 2 via ORAL
  Filled 2019-09-17 (×2): qty 2

## 2019-09-17 MED ORDER — OXYCODONE HCL 5 MG PO TABS: 10.0000 mg | ORAL_TABLET | ORAL | Status: DC | PRN

## 2019-09-17 MED ORDER — ONDANSETRON HCL 4 MG PO TABS: 4.0000 mg | ORAL_TABLET | ORAL | Status: DC | PRN

## 2019-09-17 MED ORDER — OXYCODONE HCL 5 MG PO TABS: 5.0000 mg | ORAL_TABLET | ORAL | Status: DC | PRN

## 2019-09-17 MED ORDER — ACETAMINOPHEN 325 MG PO TABS: 650.0000 mg | ORAL_TABLET | ORAL | Status: DC | PRN

## 2019-09-17 MED ORDER — ONDANSETRON HCL 4 MG/2ML IJ SOLN: 4.0000 mg | INTRAMUSCULAR | Status: DC | PRN

## 2019-09-17 MED ORDER — IBUPROFEN 600 MG PO TABS
600.0000 mg | ORAL_TABLET | Freq: Four times a day (QID) | ORAL | Status: DC
  Administered 2019-09-17 – 2019-09-18 (×6): 600 mg via ORAL
  Filled 2019-09-17 (×6): qty 1

## 2019-09-17 MED ORDER — PRENATAL MULTIVITAMIN CH
1.0000 | ORAL_TABLET | Freq: Every day | ORAL | Status: DC
  Administered 2019-09-17 – 2019-09-18 (×2): 1 via ORAL
  Filled 2019-09-17 (×2): qty 1

## 2019-09-17 MED ORDER — COCONUT OIL OIL
1.0000 "application " | TOPICAL_OIL | Status: DC | PRN
  Administered 2019-09-17: 1 via TOPICAL

## 2019-09-17 NOTE — Lactation Note (Addendum)
This note was copied from a baby's chart. Lactation Consultation Note  Patient Name: Natalie Ramirez [id number] Date: 09/17/2019   3rd low blood sugar of 26.   Baby fed 15 ml of donor milk after Mom signed consent form.   Pediatrician ordered glucose gel 1.5 ml.  DEBP set up and assisted Mom with first pumping.    Plan- 1- Keep baby STS as much as possible 2- breast massage and hand express to feed baby colostrum by spoon or curved tip syringe 3- Watch baby for cues and offer breast often, ask for help with latch prn. 4- Pump both breasts 15 mins on initiation setting.  5- Supplement baby with EBM+/donor milk prn   Lactation Tools Discussed/Used Tools: Pump;Bottle Breast pump type: Double-Electric Breast Pump Pump Review: Setup, frequency, and cleaning;Milk Storage Initiated by:: Cipriano Mile RN IBCLC Date initiated:: 09/17/19     Tilda Burrow E 09/17/2019, 1:46 PM

## 2019-09-17 NOTE — Lactation Note (Signed)
This note was copied from a baby's chart. Lactation Consultation Note Baby 4 hrs old. Mom has asked for Lactation since delivery. RN's has helped mom latch.  Mom has compressible everted nipples.  Hand expression easily expressed colostrum.  Mom must need assistance in positioning d/t nipples appear for good latching. Newborn behavior, STS, I&O, breast massage, milk storage, supply and demand discussed. Mom encouraged to feed baby 8-12 times/24 hours and with feeding cues.  Baby sleeping at this time. Encouraged mom to rest while baby is resting, set clock for 3 hr feeding, but feed w/cues before 3 hrs if needed. Mom stated understood. Encouraged mom to call for assistance or questions. Lactation brochure given.  Patient Name: Natalie Ramirez [id number] Date: 09/17/2019 Reason for consult: Initial assessment;Primapara;Early term 37-38.6wks   Maternal Data Has patient been taught Hand Expression?: Yes Does the patient have breastfeeding experience prior to this delivery?: No  Feeding Feeding Type: Breast Fed  LATCH Score       Type of Nipple: Everted at rest and after stimulation  Comfort (Breast/Nipple): Soft / non-tender        Interventions Interventions: Breast feeding basics reviewed;Hand express;Expressed milk;Breast massage;Breast compression  Lactation Tools Discussed/Used WIC Program: No   Consult Status Consult Status: Follow-up Date: 09/17/19 Follow-up type: In-patient    Theodoro Kalata 09/17/2019, 3:35 AM

## 2019-09-17 NOTE — Lactation Note (Signed)
This note was copied from a baby's chart. Lactation Consultation Note RN alerted LC of low glucose. LC spoke w/mom encouraging her to collect more colostrum to give to baby. Mom stated she was doing STS and waiting on next glucose. LC informed mom if next sugar was low they would give glucose gel, and if glucose cont. To be low baby would go to NICU. STS is great encouraged mom to give the baby some colostrum. Reported to RN [facility] spoke with mom. Patient Name: Natalie Ramirez [id number] Date: 09/17/2019 Reason for consult: Initial assessment;Primapara;Early term 37-38.6wks   Maternal Data Has patient been taught Hand Expression?: Yes Does the patient have breastfeeding experience prior to this delivery?: No  Feeding Feeding Type: Breast Milk  LATCH Score Latch: Repeated attempts needed to sustain latch, nipple held in mouth throughout feeding, stimulation needed to elicit sucking reflex.  Audible Swallowing: Spontaneous and intermittent  Type of Nipple: Everted at rest and after stimulation  Comfort (Breast/Nipple): Soft / non-tender  Hold (Positioning): Assistance needed to correctly position infant at breast and maintain latch.  LATCH Score: 8  Interventions Interventions: Assisted with latch;Skin to skin;Hand express;Adjust position;Support pillows  Lactation Tools Discussed/Used WIC Program: No   Consult Status Consult Status: Follow-up Date: 09/17/19 Follow-up type: In-patient    Sonny Poth, Elta Guadeloupe 09/17/2019, 5:08 AM

## 2019-09-17 NOTE — Lactation Note (Addendum)
This note was copied from a baby's chart. Lactation Consultation Note  Patient Name: Natalie Ramirez [id number] Date: 09/17/2019 Reason for consult: Follow-up assessment;1st time breastfeeding;Primapara;Maternal endocrine disorder;Early term 37-38.6wks Type of Endocrine Disorder?: Diabetes GDM insulin dependent, partial thyroidectomy  LC in to visit with P31 Mom of ET infant at 26 hrs old.  Mom has had baby STS until 30 mins ago.  Baby swaddled in blanket in crib.    Baby has had 2 blood sugars 26 and 25, last one was at 0549.  Baby received glucose gel after 2nd low blood sugar.  Baby has had 1 20 min breastfeed at about 3 hrs old. LC had expressed and syringe fed 5 ml colostrum.  Breast massage and hand expression reviewed, Mom had been trying to hand express, without any results.  Offered to help and Mom accepted.  2.5 ml colostrum collected with hand expression from both breasts.  Mom feeling uterine cramping and discomfort with hand expression, but wanted LC to continue.  With FOB's help, baby was syringe fed the colostrum.    Placed baby STS in football hold with pillow support.  After several attempts, baby was able to attain a deep latch to breast.  Both upper and lower lips needed help to flange to improve seal.  Baby with strong jaw extensions and some swallows identified.  Baby very alert and no jitteriness noted.  Baby still rhythmic at the breast after 10 mins when [place] left room.  Encouraged Mom to continue with baby STS after breastfeeding her.  3rd CBG to be drawn at 0930.    Talked to Mom about use of donor breast milk if added supplement needed.  Mom wanting to use her own milk if possible.  Explained that this would be only if baby's blood glucose isn't >40 at next check.    Talked about adding pumping if donor milk is needed.  Baby at the breast is the best way for baby to transfer milk.  To ask for help with positioning and latching as baby should be offered breast with any  cues.   Feeding Feeding Type: Breast Fed  LATCH Score Latch: Repeated attempts needed to sustain latch, nipple held in mouth throughout feeding, stimulation needed to elicit sucking reflex.  Audible Swallowing: A few with stimulation  Type of Nipple: Everted at rest and after stimulation  Comfort (Breast/Nipple): Soft / non-tender  Hold (Positioning): Assistance needed to correctly position infant at breast and maintain latch.  LATCH Score: 7  Interventions Interventions: Breast feeding basics reviewed;Assisted with latch;Skin to skin;Breast massage;Hand express;Breast compression;Adjust position;Support pillows;Position options;Expressed milk;Coconut oil;Hand pump  Lactation Tools Discussed/Used Tools: Coconut oil   Consult Status Consult Status: Follow-up Date: 09/18/19 Follow-up type: In-patient    Broadus John 09/17/2019, 8:40 AM

## 2019-09-17 NOTE — Progress Notes (Signed)
Patient is eating, ambulating, voiding.  Pain control is good.  Appropriate lochia, no complaints.  Vitals:   09/17/19 0032 09/17/19 0047 09/17/19 0113 09/17/19 0250  BP: (!) 126/102 129/79 125/72 111/70  Pulse: 78 79 81 85  Resp: 15   16  Temp:  97.6 F (36.4 C) 98 F (36.7 C) 98.2 F (36.8 C)  TempSrc:  Oral  Oral  SpO2:   98%   Weight:      Height:        Fundus firm Ext: no calf tenderness  Lab Results  Component Value Date   WBC 15.2 (H) 09/17/2019   HGB 10.1 (L) 09/17/2019   HCT 30.9 (L) 09/17/2019   MCV 86.6 09/17/2019   PLT 106 (L) 09/17/2019    --/--/A POS, A POS Performed at [facility] [address], [place], [place] [address]  (01/21 0400)  A/P Post partum day #1 BPs normal to mild range, am labs: AST/ALT/PLT improved, asymptomatic  Routine care.  Expect d/c 1/23.    Natalie Ramirez

## 2019-09-17 NOTE — Progress Notes (Signed)
Patient told to call out for CBG checks before meals. Plan of care told.

## 2019-09-18 LAB — GLUCOSE, CAPILLARY: Glucose-Capillary: 76 mg/dL (ref 70–99)

## 2019-09-18 NOTE — Lactation Note (Addendum)
This note was copied from a baby's chart. Lactation Consultation Note  Patient Name: Natalie Ramirez [id number] Date: 09/18/2019 Reason for consult: Follow-up assessment;Mother's request P1, 30 hour female infant, -3% weight loss. Mom requesting assistance with latch due infant not latching at this feeding. Mom latched infant on left breast using football hold, LC ask mom squeeze small amount of colostrum out and bring infant to breast, infant latched with wide mouth, tongue down, nose and chin touching breast. Swallows observed and infant was still breastfeeding after 10 minutes when [place] left room. LC had mom re-latch infant at breast, mom will continue working on latching infant at breast. Per mom, infant latched well for about 8 -10 minutes at 3 am and she gave infant combination of EBM and donor milk at last feeding. Mom knows to call RN or LC if she need assistance with latching infant at breast. Mom will continue to breastfeed infant according to hunger cues, on demand and not exceed 3 hours without breastfeeding infant. Mom will latch infant at breast first, then offer infant any pumped or hand express breast milk mixed with donor milk and supplement feeding according infant's age/ hours of life.   Maternal Data    Feeding Feeding Type: Breast Fed  LATCH Score Latch: Grasps breast easily, tongue down, lips flanged, rhythmical sucking.  Audible Swallowing: Spontaneous and intermittent  Type of Nipple: Everted at rest and after stimulation  Comfort (Breast/Nipple): Soft / non-tender  Hold (Positioning): Assistance needed to correctly position infant at breast and maintain latch.  LATCH Score: 9  Interventions Interventions: Adjust position;Assisted with latch;Skin to skin;Support pillows;Position options  Lactation Tools Discussed/Used     Consult Status Consult Status: Follow-up Date: 09/18/19 Follow-up type: In-patient    Vicente Serene 09/18/2019, 5:34 AM

## 2019-09-18 NOTE — Discharge Summary (Signed)
Obstetric Discharge Summary Reason for Admission: rupture of membranes Prenatal Procedures: ultrasound Intrapartum Procedures: spontaneous vaginal delivery Postpartum Procedures: none Complications-Operative and Postpartum: 2nd degree perineal laceration Hemoglobin  Date Value Ref Range Status  09/17/2019 10.1 (L) 12.0 - 15.0 g/dL Final   HCT  Date Value Ref Range Status  09/17/2019 30.9 (L) 36.0 - 46.0 % Final    Physical Exam:  General: alert and cooperative Lochia: appropriate per pt lessened Uterine Fundus: see RN note DVT Evaluation: no calf tenderness  Discharge Diagnoses: Term Pregnancy-delivered  Discharge Information: Date: 09/18/2019 Activity: pelvic rest Diet: routine Medications: PNV and Ibuprofen Condition: stable Instructions: refer to practice specific booklet Discharge to: home Follow-up Information    Natalie Charles, MD Follow up in 4 week(s).   Specialty: Obstetrics Contact information: [place] [address] [phone]           Newborn Data: Live born female  Birth Weight: 7 lb 7.6 oz (3391 g) APGAR: 13, 9  Newborn Delivery   Birth date/time: 09/16/2019 23:06:00 Delivery type: Vaginal, Spontaneous      Home with mother.  Natalie Ramirez 09/18/2019, 8:19 AM

## 2019-09-18 NOTE — Lactation Note (Signed)
This note was copied from a baby's chart. Lactation Consultation Note  Patient Name: Natalie Ramirez [id number] Date: 09/18/2019 Reason for consult: Mother's request;Difficult latch Type of Endocrine Disorder?: Diabetes P1, 58 hour female infant, low blood sugars and being supplemented with donor breast milk. Mom hx: thyroidectomy, DM2, and AMA. Mom has been using DEBP and has pumped 4 times. Mom asked for [facility] services due infant not latching at breast today has been receiving donor breast milk.  Per mom, infant had 10 mls of donor milk prior to [facility] entering the room, infant was still cuing to breastfeed. Mom latched infant on left breast using the football hold position, infant latched wide mouth, tongue down, nose and chin touching breast, swallows observed, infant breastfeed for 15 minutes and appeared content afterwards. Infant was swaddle by RN and placed in basinet. LC reviewed hand expression and mom taught back easily expressing 5 mls of colostrum that she will mix with donor milk at next feeding. Mom 's plan: 1. Mom will continue to latch infant at breast and breastfeed according to hunger cues, on demand and not exceed 3 hours without breastfeeding infant. 2. Mom will continue to use DEBP, massaging  breast while pumping and do hand expression afterwards. 3. Mom will mix her EBM with donor milk and give to infant after latching infant at breast according infant's age/ hours of life. 4. Mom knows to call RN or LC if she has any questions, concerns or need further assistance with latching infant at breast.   Maternal Data Has patient been taught Hand Expression?: Yes  Feeding Feeding Type: Breast Fed  LATCH Score Latch: Grasps breast easily, tongue down, lips flanged, rhythmical sucking.  Audible Swallowing: Spontaneous and intermittent  Type of Nipple: Everted at rest and after stimulation  Comfort (Breast/Nipple): Soft / non-tender  Hold (Positioning): Assistance needed to  correctly position infant at breast and maintain latch.  LATCH Score: 9  Interventions Interventions: Assisted with latch;Adjust position;Support pillows;Position options;Hand express;Expressed milk;Hand pump;Skin to skin  Lactation Tools Discussed/Used     Consult Status Consult Status: Follow-up Date: 09/18/19 Follow-up type: In-patient    Vicente Serene 09/18/2019, 1:25 AM

## 2019-09-18 NOTE — Lactation Note (Signed)
This note was copied from a baby's chart. Lactation Consultation Note  Patient Name: Natalie Ramirez [id number] Date: 09/18/2019 Reason for consult: Follow-up assessment;Maternal endocrine disorder;Early term 37-38.6wks;1st time breastfeeding;Primapara Type of Endocrine Disorder?: Diabetes  LC in to visit with P22 Mom of ET infant on day of discharge, baby at 3% weight loss, with great output.  Mom has been exclusively breastfeeding and adding 10-15 ml of donor milk due to low blood sugars.    Mom hasn't double pumped since last night.    Baby had just fed for 10 mins, and sleeping on Mom's lap.  Baby started showing subtle cues, so recommended she latch baby to second breast.  Hand expression easily expressed colostrum.  Mom latched baby in football hold.  Added another pillow for support.  Baby latched easily, but noted cheek dimpling when she was sucking.  Showed FOB how to tug on chin to open gape of her mouth wider.  Baby has a slight recessed lower jaw.  After baby was sucking with deep jaw extensions and flanged lips, took baby off to assess nipple.  Nipple slightly pinched on outer edge of left nipple.  Digital oral assessment revealed a very high palate.  Explained to parents the importance of baby getting very deep on breast to create suction.  Assisted with a deeper latch, and untucked lower lip.  Baby having deep jaw extensions and swallows identified.   Mom shown how to use alternate breast compression during feeding.  Encouraged STS and feeding baby often on cue.  Encouraged adding hand expression to a spoon to offer baby a "dessert or appetizer"  Mom has a Medela DEBP at home.  Encouraged her to double pump after every other feeding for 15 mins to support a full milk supply.    Talked about getting good OP lactation follow-up.  Mom would like a referral to see [facility].  Message sent to clinic.  Pediatrician appt set for Monday, Jan.25.    Mom aware of OP lactation support and  encouraged to call prn.    Feeding Feeding Type: Breast Fed  LATCH Score Latch: Grasps breast easily, tongue down, lips flanged, rhythmical sucking.  Audible Swallowing: Spontaneous and intermittent  Type of Nipple: Everted at rest and after stimulation  Comfort (Breast/Nipple): Soft / non-tender  Hold (Positioning): Assistance needed to correctly position infant at breast and maintain latch.  LATCH Score: 9  Interventions Interventions: Breast feeding basics reviewed;Assisted with latch;Skin to skin;Breast massage;Hand express;Breast compression;Adjust position;Position options;Support pillows;Expressed milk;Coconut oil;DEBP;Hand pump  Lactation Tools Discussed/Used Tools: Pump;Coconut oil Breast pump type: Double-Electric Breast Pump   Consult Status Consult Status: Complete Date: 09/18/19 Follow-up type: In-patient    Broadus John 09/18/2019, 11:25 AM

## 2019-09-23 ENCOUNTER — Inpatient Hospital Stay (HOSPITAL_COMMUNITY)
Admission: AD | Admit: 2019-09-23 | Discharge: 2019-09-25 | DRG: 776 | Disposition: A | Discharge status: Home / Self Care | Payer: BC Managed Care – PPO | Attending: Obstetrics & Gynecology | Admitting: Obstetrics & Gynecology

## 2019-09-23 ENCOUNTER — Encounter (HOSPITAL_COMMUNITY): Payer: Self-pay | Admitting: Obstetrics and Gynecology

## 2019-09-23 ENCOUNTER — Other Ambulatory Visit: Payer: Self-pay

## 2019-09-23 DIAGNOSIS — O2413 Pre-existing diabetes mellitus, type 2, in the puerperium: Secondary | ICD-10-CM | POA: Diagnosis present

## 2019-09-23 DIAGNOSIS — E039 Hypothyroidism, unspecified: Secondary | ICD-10-CM | POA: Diagnosis present

## 2019-09-23 DIAGNOSIS — E119 Type 2 diabetes mellitus without complications: Secondary | ICD-10-CM | POA: Diagnosis present

## 2019-09-23 DIAGNOSIS — O99285 Endocrine, nutritional and metabolic diseases complicating the puerperium: Secondary | ICD-10-CM | POA: Diagnosis present

## 2019-09-23 DIAGNOSIS — O1405 Mild to moderate pre-eclampsia, complicating the puerperium: Principal | ICD-10-CM | POA: Diagnosis present

## 2019-09-23 DIAGNOSIS — Z7984 Long term (current) use of oral hypoglycemic drugs: Secondary | ICD-10-CM

## 2019-09-23 DIAGNOSIS — O1415 Severe pre-eclampsia, complicating the puerperium: Secondary | ICD-10-CM | POA: Diagnosis present

## 2019-09-23 LAB — COMPREHENSIVE METABOLIC PANEL
ALT: 27 U/L (ref 0–44)
AST: 21 U/L (ref 15–41)
Albumin: 3.1 g/dL — ABNORMAL LOW (ref 3.5–5.0)
Alkaline Phosphatase: 80 U/L (ref 38–126)
Anion gap: 8 (ref 5–15)
BUN: 13 mg/dL (ref 6–20)
CO2: 26 mmol/L (ref 22–32)
Calcium: 8.7 mg/dL — ABNORMAL LOW (ref 8.9–10.3)
Chloride: 105 mmol/L (ref 98–111)
Creatinine, Ser: 0.7 mg/dL (ref 0.44–1.00)
GFR calc Af Amer: >60 mL/min (ref >60)
GFR calc non Af Amer: >60 mL/min (ref >60)
Glucose, Bld: 107 mg/dL — ABNORMAL HIGH (ref 70–99)
Potassium: 4 mmol/L (ref 3.5–5.1)
Sodium: 139 mmol/L (ref 135–145)
Total Bilirubin: 0.6 mg/dL (ref 0.3–1.2)
Total Protein: 6.5 g/dL (ref 6.5–8.1)

## 2019-09-23 LAB — CBC
HCT: 36.1 % (ref 36.0–46.0)
Hemoglobin: 11.4 g/dL — ABNORMAL LOW (ref 12.0–15.0)
MCH: 28.6 pg (ref 26.0–34.0)
MCHC: 31.6 g/dL (ref 30.0–36.0)
MCV: 90.5 fL (ref 80.0–100.0)
Platelets: 310 10*3/uL (ref 150–400)
RBC: 3.99 MIL/uL (ref 3.87–5.11)
RDW: 15.9 % — ABNORMAL HIGH (ref 11.5–15.5)
WBC: 11.8 10*3/uL — ABNORMAL HIGH (ref 4.0–10.5)
nRBC: 0 % (ref 0.0–0.2)

## 2019-09-23 LAB — TYPE AND SCREEN
ABO/RH(D): A POS
Antibody Screen: NEGATIVE

## 2019-09-23 LAB — PROTEIN / CREATININE RATIO, URINE
Creatinine, Urine: 74.87 mg/dL
Total Protein, Urine: <6 mg/dL

## 2019-09-23 LAB — GLUCOSE, CAPILLARY
Glucose-Capillary: 109 mg/dL — ABNORMAL HIGH (ref 70–99)
Glucose-Capillary: 151 mg/dL — ABNORMAL HIGH (ref 70–99)

## 2019-09-23 MED ORDER — PRENATAL MULTIVITAMIN CH
1.0000 | ORAL_TABLET | Freq: Every day | ORAL | Status: DC
  Administered 2019-09-23 – 2019-09-24 (×2): 1 via ORAL
  Filled 2019-09-23 (×2): qty 1

## 2019-09-23 MED ORDER — METFORMIN HCL ER 750 MG PO TB24
2000.0000 mg | ORAL_TABLET | Freq: Every day | ORAL | Status: DC
  Administered 2019-09-24 – 2019-09-25 (×2): 2000 mg via ORAL
  Filled 2019-09-23 (×2): qty 1

## 2019-09-23 MED ORDER — BUTORPHANOL TARTRATE 1 MG/ML IJ SOLN
1.0000 mg | Freq: Once | INTRAMUSCULAR | Status: AC
  Administered 2019-09-23: 13:00:00 1 mg via INTRAVENOUS
  Filled 2019-09-23: qty 1

## 2019-09-23 MED ORDER — MAGNESIUM SULFATE BOLUS VIA INFUSION
4.0000 g | Freq: Once | INTRAVENOUS | Status: AC
  Administered 2019-09-23: 4 g via INTRAVENOUS
  Filled 2019-09-23: qty 1000

## 2019-09-23 MED ORDER — MAGNESIUM SULFATE 40 GM/1000ML IV SOLN
2.0000 g/h | INTRAVENOUS | Status: AC
  Administered 2019-09-24: 2 g/h via INTRAVENOUS
  Filled 2019-09-23 (×2): qty 1000

## 2019-09-23 MED ORDER — LEVOTHYROXINE SODIUM 25 MCG PO TABS
100.0000 ug | ORAL_TABLET | Freq: Every day | ORAL | Status: DC
  Administered 2019-09-24 – 2019-09-25 (×2): 100 ug via ORAL
  Filled 2019-09-23 (×2): qty 4

## 2019-09-23 MED ORDER — ASPIRIN-ACETAMINOPHEN-CAFFEINE 250-250-65 MG PO TABS
2.0000 | ORAL_TABLET | Freq: Four times a day (QID) | ORAL | Status: DC | PRN
  Administered 2019-09-23 – 2019-09-25 (×4): 2 via ORAL
  Filled 2019-09-23 (×5): qty 2

## 2019-09-23 MED ORDER — LACTATED RINGERS IV SOLN: INTRAVENOUS | Status: DC

## 2019-09-23 NOTE — Lactation Note (Addendum)
Lactation Consultation Note  Patient Name: Natalie Ramirez [id number] Date: 09/23/2019 Reason for consult: Follow-up assessment;Term;Primapara;1st time breastfeeding  P1 mother whose infant is now 35 days old.  Mother was readmitted today and will start magnesium sulfate after baby's feeding session.  She has complained of a headache.  Baby is 38+0 weeks as of today.  She has not latched and fed well (see previous LC note).  Returned to assist mother with latching for this feeding.  Father was holding baby when I arrived.  She was awake and alert.  Suggested mother remove clothing and feed STS.  Mother's breasts are large, soft and non tender and nipples are slightly irritated, pink and everted.    Upon my gloved finger, baby is noted to have a high palate.  She has a biting motion instead of a sucking motion.  Her chin is small and recessed.  Baby does not extend her tongue over the alveolar ridge but tries to keep it high up to the palate. It appears she may have a short posterior frenulum.  Suck training performed.  Positioned mother appropriately and assisted baby to latch in the football hold on the right breast.  She took a couple of sucks and became frustrated and pushed back.  A few more attempts made with the same results.  Showed mother how to get a deep latch and mother admitted to not being able to latch as deeply.  Discussed breast compressions and keeping her in deep to the breast during feedings.  Discussed finger placement at the breast and supporting the breast for baby.  Mother informed me that she almost always repeats this behavior at the breast and will not stay latched and sucking.  Suggested we try a NS and mother willing.  #24 NS chosen and demonstrated proper placement at the breast.  Mother did a return demonstration.  Discussed the use of the NS.  Pre-filled the tip with mother's EBM and latched baby.  Baby much more willing to attempt sucking with the EBM in tip of NS.  She still  required almost constant stimulation due to being sleepy at the breast.  Showed mother how to actively keep her feeding and awake.  Pre-filled the NS tip 3 more times and she continued to consume the milk from the tip.  Discussed total feeding times of 30 minutes to help baby conserve calories.  With all the education I provided we did go over this time frame but mother will limit her time to 30 minutes from this point forward.  Removed baby from the breast after feeding on/off for 25 minutes and demonstrated paced bottle feeding using mother's EBM and the Similac gold slow flow nipple.  Baby did well with jaw and cheek support and consumed an additional 40 mls; pacing nicely with no emesis during feeding.  She burped well after feeding.  Swaddled and placed baby in bassinet so mother could pump.  During pumping, RN entered the room for initiation of the magnesium sulfate.  Reviewed feeding plan for the night.  Mother prefers to record plan and notes in her phone rather than the white board.  She will call for assistance as needed.  Father left when I arrived to meet his in-laws traveling from out of town but should return this evening to help mother.  RN updated.   Maternal Data Formula Feeding for Exclusion: No Has patient been taught Hand Expression?: Yes Does the patient have breastfeeding experience prior to this delivery?: No  Feeding  Feeding Type: Breast Fed  LATCH Score Latch: Repeated attempts needed to sustain latch, nipple held in mouth throughout feeding, stimulation needed to elicit sucking reflex.  Audible Swallowing: A few with stimulation  Type of Nipple: Everted at rest and after stimulation  Comfort (Breast/Nipple): Soft / non-tender  Hold (Positioning): Assistance needed to correctly position infant at breast and maintain latch.  LATCH Score: 7  Interventions Interventions: Breast feeding basics reviewed;Assisted with latch;Skin to skin;Breast massage;Hand express;Breast  compression;Adjust position;DEBP;Position options;Support pillows  Lactation Tools Discussed/Used Tools: Pump;Flanges;Nipple Shields Nipple shield size: 24 Flange Size: 27 Breast pump type: Double-Electric Breast Pump Pump Review: Setup, frequency, and cleaning;Milk Storage(Reviewed)   Consult Status Consult Status: Follow-up Date: 09/24/19 Follow-up type: In-patient    Little Ishikawa 09/23/2019, 4:24 PM

## 2019-09-23 NOTE — H&P (Signed)
44 y.o. G6P1001 postpartum day 6 with mild preeclampsia and d/ced without BP  treatment reported home BPs of 160-170s/90-100s.  She also reports HA started yesterday and relieved some what by excedrin migraine but still present.  Pt denies other sx of preeclampsia except some RUQ pain.    Pt had uncomplicated SVD but did have mildly elevated LFTs and platelets that got as low as 103.  Past Medical History:  Diagnosis Date  . Cancer [facility])    partial thyroidectomy  . Diabetes mellitus without complication ([place])   . Hyperthyroidism    HX partial thyroidectomy due to CA   Past Surgical History:  Procedure Laterality Date  . ABCESS DRAINAGE    . CERVICAL FUSION    . THYROIDECTOMY, PARTIAL    . WISDOM TOOTH EXTRACTION      Social History   Socioeconomic History  . Marital status: Married    Spouse name: Not on file  . Number of children: Not on file  . Years of education: Not on file  . Highest education level: Not on file  Occupational History  . Not on file  Tobacco Use  . Smoking status: Never Smoker  . Smokeless tobacco: Never Used  Substance and Sexual Activity  . Alcohol use: Not Currently  . Drug use: Not Currently  . Sexual activity: Yes    Birth control/protection: None  Other Topics Concern  . Not on file  Social History Narrative  . Not on file   Social Determinants of Health   Financial Resource Strain:   . Difficulty of Paying Living Expenses: Not on file  Food Insecurity:   . Worried About Charity fundraiser in the Last Year: Not on file  . Ran Out of Food in the Last Year: Not on file  Transportation Needs:   . Lack of Transportation (Medical): Not on file  . Lack of Transportation (Non-Medical): Not on file  Physical Activity:   . Days of Exercise per Week: Not on file  . Minutes of Exercise per Session: Not on file  Stress:   . Feeling of Stress : Not on file  Social Connections:   . Frequency of Communication with Friends and Family: Not on  file  . Frequency of Social Gatherings with Friends and Family: Not on file  . Attends Religious Services: Not on file  . Active Member of Clubs or Organizations: Not on file  . Attends Archivist Meetings: Not on file  . Marital Status: Not on file  Intimate Partner Violence:   . Fear of Current or Ex-Partner: Not on file  . Emotionally Abused: Not on file  . Physically Abused: Not on file  . Sexually Abused: Not on file    No current facility-administered medications on file prior to encounter.   Current Outpatient Medications on File Prior to Encounter  Medication Sig Dispense Refill  . aspirin-acetaminophen-caffeine (EXCEDRIN MIGRAINE) 250-250-65 MG tablet Take 2 tablets by mouth every 6 (six) hours as needed for headache.    . levothyroxine (SYNTHROID) 100 MCG tablet Take 100 mcg by mouth every morning.    . metFORMIN (GLUCOPHAGE-XR) 500 MG 24 hr tablet Take 2,000 mg by mouth daily.    . Prenatal Vit-Fe Fumarate-FA (PRENATAL MULTIVITAMIN) TABS tablet Take 1 tablet by mouth at bedtime.       No Known Allergies  Vitals:   09/23/19 1121 09/23/19 1323 09/23/19 1326  BP: 136/85 (!) 154/74 139/74  Pulse: 81 62 66  Resp: 18  Temp: 99.4 F (37.4 C)    TempSrc: Oral    SpO2: 97%      Lungs: clear to ascultation Cor:  RRR Abdomen:  soft, nontender, nondistended. Ex:  no cords, erythema Pelvic:  deferrred  Results for orders placed or performed during the hospital encounter of 09/23/19 (from the past 24 hour(s))  CBC     Status: Abnormal   Collection Time: 09/23/19 11:17 AM  Result Value Ref Range   WBC 11.8 (H) 4.0 - 10.5 K/uL   RBC 3.99 3.87 - 5.11 MIL/uL   Hemoglobin 11.4 (L) 12.0 - 15.0 g/dL   HCT 36.1 36.0 - 46.0 %   MCV 90.5 80.0 - 100.0 fL   MCH 28.6 26.0 - 34.0 pg   MCHC 31.6 30.0 - 36.0 g/dL   RDW 15.9 (H) 11.5 - 15.5 %   Platelets 310 150 - 400 K/uL   nRBC 0.0 0.0 - 0.2 %  Comprehensive metabolic panel     Status: Abnormal   Collection Time:  09/23/19 11:17 AM  Result Value Ref Range   Sodium 139 135 - 145 mmol/L   Potassium 4.0 3.5 - 5.1 mmol/L   Chloride 105 98 - 111 mmol/L   CO2 26 22 - 32 mmol/L   Glucose, Bld 107 (H) 70 - 99 mg/dL   BUN 13 6 - 20 mg/dL   Creatinine, Ser 0.70 0.44 - 1.00 mg/dL   Calcium 8.7 (L) 8.9 - 10.3 mg/dL   Total Protein 6.5 6.5 - 8.1 g/dL   Albumin 3.1 (L) 3.5 - 5.0 g/dL   AST 21 15 - 41 U/L   ALT 27 0 - 44 U/L   Alkaline Phosphatase 80 38 - 126 U/L   Total Bilirubin 0.6 0.3 - 1.2 mg/dL   GFR calc non Af Amer >60 >60 mL/min   GFR calc Af Amer >60 >60 mL/min   Anion gap 8 5 - 15  Type and screen [place]     Status: None   Collection Time: 09/23/19 11:17 AM  Result Value Ref Range   ABO/RH(D) A POS    Antibody Screen NEG    Sample Expiration      09/26/2019,2359 Performed at [facility], [address] [place], [place] [address]   Protein / creatinine ratio, urine     Status: None   Collection Time: 09/23/19 11:44 AM  Result Value Ref Range   Creatinine, Urine 74.87 mg/dL   Total Protein, Urine <6 mg/dL   Protein Creatinine Ratio        0.00 - 0.15 mg/mg[Cre]   A/P:  PP #6 with elevated BPs after having GHTN during labor.  Now BPs elevated again but labs actually resolved.  However, given: 1. Still has headache despite stadol 2. Had borderline labs at delivery 3. Has borderline severe BPs here 4. BPs at home, if accurate, were severe   Will start magnesium sulfate for severe preeclampsia (1+ pr in urine) for at least 24 hours.  Also showing signs of dehydration- will have pt po hydrate instead of IV.  Continue current meds and finger sticks for Type 2 DM.  Thyroid meds ordered.  Daria Pastures

## 2019-09-23 NOTE — Lactation Note (Signed)
Lactation Consultation Note  Patient Name: Natalie Ramirez [id number] Date: 09/23/2019 Reason for consult: Initial assessment;Early term 37-38.6wks;1st time breastfeeding;Primapara   P1 Natalie Ramirez whose was readmitted this morning with pre eclampsia symptoms including a headache that has gotten worse since this a.m.    Natalie Ramirez is tearful and extremely concerned because her baby is now one week old and she does not consistently feed well at the breast.  Natalie Ramirez stated she will have an "occasional" good feeding.  Natalie Ramirez has been supplementing via syringe after breast feeding attempts.  She has produced adequate amounts of EBM and has been able to fulfill baby's nutritional needs via EBM.  Father is currently at home with the baby and is feeding her for 1200.  He will bring baby to Natalie Ramirez after this feeding.  In the meantime, I initiated the DEBP for Natalie Ramirez.  Reviewed the pump and pump set up.  Discussed using the maintenance mode.  Natalie Ramirez will pump now using the #27 flanges and will save any EBM she obtains.  I plan to return at the 1500 feeding to observe and assist with latching.  Natalie Ramirez appreciative.  She will be given a dose of Stadol for her headache; verified that this medication is safe for breast feeding.  RN updated and will call me for the next feeding.   Consult Status Consult Status: Follow-up Date: 09/24/19 Follow-up type: In-patient    Little Ishikawa 09/23/2019, 12:53 PM

## 2019-09-24 ENCOUNTER — Encounter (HOSPITAL_COMMUNITY): Payer: Self-pay | Admitting: Obstetrics and Gynecology

## 2019-09-24 DIAGNOSIS — O1415 Severe pre-eclampsia, complicating the puerperium: Secondary | ICD-10-CM | POA: Diagnosis present

## 2019-09-24 LAB — GLUCOSE, CAPILLARY
Glucose-Capillary: 99 mg/dL (ref 70–99)
Glucose-Capillary: 104 mg/dL — ABNORMAL HIGH (ref 70–99)
Glucose-Capillary: 117 mg/dL — ABNORMAL HIGH (ref 70–99)
Glucose-Capillary: 139 mg/dL — ABNORMAL HIGH (ref 70–99)

## 2019-09-24 MED ORDER — KETOROLAC TROMETHAMINE 30 MG/ML IJ SOLN: 30.0000 mg | Freq: Once | INTRAMUSCULAR | Status: DC

## 2019-09-24 MED ORDER — PROCHLORPERAZINE EDISYLATE 10 MG/2ML IJ SOLN
10.0000 mg | Freq: Once | INTRAMUSCULAR | Status: AC
  Administered 2019-09-24: 10 mg via INTRAVENOUS
  Filled 2019-09-24: qty 2

## 2019-09-24 MED ORDER — DIPHENHYDRAMINE HCL 50 MG/ML IJ SOLN
12.5000 mg | Freq: Once | INTRAMUSCULAR | Status: AC
  Administered 2019-09-24: 12.5 mg via INTRAVENOUS
  Filled 2019-09-24: qty 1

## 2019-09-24 NOTE — Progress Notes (Signed)
Post Partum Day 8 Subjective: Reports HA persists. history of migraines and idiopathic intracranial hypertension. HA usually resolved with excedrin, but did not resolve with it yesterday AM/night or this AM. Stadolol yesterday also did not help. Reports HA is tension in the front, sometimes R>L and sometimes L>R. Denies vision changes today or RUQ pain. Tolerating magnesium. Bleeding and pain otherwise controlled.   Objective: Patient Vitals for the past 24 hrs:  BP Temp Temp src Pulse Resp SpO2 Height Weight  09/24/19 0855 122/65 98.1 F (36.7 C) Oral 81 18 99 % -- --  09/24/19 0508 -- -- -- -- 18 -- -- --  09/24/19 0456 120/72 98 F (36.7 C) Oral 68 -- -- -- --  09/24/19 0410 -- -- -- -- 17 -- -- --  09/24/19 0212 -- -- -- -- 18 -- -- --  09/24/19 0136 -- -- -- -- 18 -- 5\' 6"  (1.676 m) 103.4 kg  09/24/19 0001 129/72 -- -- 60 18 -- -- --  09/23/19 2304 127/66 -- -- 79 18 98 % -- --  09/23/19 2154 125/68 -- -- 79 18 96 % -- --  09/23/19 2151 -- -- -- -- 18 -- -- --  09/23/19 2038 125/82 98.4 F (36.9 C) Oral 84 18 99 % -- --  09/23/19 1943 (!) 148/81 98.9 F (37.2 C) Oral 95 18 99 % -- --  09/23/19 1759 132/77 98.6 F (37 C) Oral 75 18 97 % -- --  09/23/19 1655 121/78 -- -- 80 -- 99 % -- --  09/23/19 1645 127/76 -- -- 84 -- -- -- --  09/23/19 1630 140/84 -- -- 81 -- -- -- --  09/23/19 1620 130/80 -- -- 84 -- 98 % -- --  09/23/19 1435 -- -- -- -- -- 97 % -- --  09/23/19 1430 -- -- -- -- -- 98 % -- --  09/23/19 1425 -- -- -- -- -- 98 % -- --  09/23/19 1420 -- -- -- -- -- 98 % -- --  09/23/19 1415 -- -- -- -- -- 97 % -- --  09/23/19 1400 135/79 -- -- 63 -- -- -- --  09/23/19 1345 -- -- -- -- -- 97 % -- --  09/23/19 1340 -- -- -- -- -- 97 % -- --  09/23/19 1335 -- -- -- -- -- 96 % -- --  09/23/19 1330 -- -- -- -- -- 96 % -- --  09/23/19 1326 139/74 -- -- 66 -- -- -- --  09/23/19 1323 (!) 154/74 -- -- 62 -- -- -- --  09/23/19 1121 136/85 99.4 F (37.4 C) Oral 81 18 97 % -- --     Physical Exam:  General: alert and no distress RRR CTAB SCDs on  UOP: >250cc/h  Recent Labs    09/23/19 1117  WBC 11.8*  HGB 11.4*  HCT 36.1  PLT 310    Recent Labs    09/23/19 1117  NA 139  K 4.0  CL 105  BUN 13  CREATININE 0.70  GLUCOSE 107*  BILITOT 0.6  ALT 27  AST 21  ALKPHOS 80  PROT 6.5  ALBUMIN 3.1*    Recent Labs    09/23/19 1117  CALCIUM 8.7*   BG: AM 99, yesterday 109, 151 Assessment/Plan: Earlyne Rowland 44 y.o. [id number] PPD#8 sp SVD at [redacted]w[redacted]d, readmitted for severe pree HD#2 1. Severe preeclampsia: GHTN diagnosed intrapartum and borderline low plt/elevated AST/ALT, readmitted for severe Bps and severe HA at home yesterday, now on  magnesium (started 1600 1/28), plan to cont mag for total 24h. Plt/ast/alt improved on admit since delivery. -Bps currently normotensive-mild range, has not required treatment.  -HA persistent, will try compazine/benadryl this AM 2. DM2: on metformin  3. Hypothyroidism: on synthroid    LOS: 1 day   Luismario Coston K Taam-Akelman 09/24/2019, 9:24 AM

## 2019-09-24 NOTE — Progress Notes (Addendum)
Pt with persistent severe headache unrelieved by Excedrin Migraine or by dose of Stadol yesterday.  DTRs 2+, no clonus, no epigastric pain.  Normotensive.  Phoned Dr. Wendi Snipes with above information and asked if imaging of the head might be indicated.  Dr. Wendi Snipes to round in about 30 minutes.  See orders for Compazine & Benadryl doses to be given IV.  Dr. Wendi Snipes says she will consider imaging if the medication does not help pt's headache.

## 2019-09-24 NOTE — Lactation Note (Signed)
Lactation Consultation Note  Patient Name: Natalie Ramirez [id number] Date: 09/24/2019 Reason for consult: Follow-up assessment;Early term 37-38.6wks;Difficult latch;Other (Comment)(readmit for headache)   Mom is concerned that baby is not staying awake to feed at breast.  Mom is pumping using DEBP, collecting 115 ml per mom/dad.  Mom requested assistance and for LC to observe latch.  Mom is not using NS and has not used it since [facility] provided it.  LC has infant suck gloved finger to assess mouth.  Infant chomped on finger without extending past gumline.  Dimpling noted.  Infant awake and mom latched in football hold.  LC provided extra pillows as mom was hunched over trying to latch.  LC involved dad in process.  A rolled burp cloth used under breast to support tissue.  Infant would latch, then fall asleep multiple times.  LC suggested a different position on the other breast.  Mom attempted cross cradle latch with infant very alert and infant would open wide to latch then slip off and repeated this multiple times.    LC suggested trying the NS.  Mom agreed.  NS was prefilled and infant latched well, no NS visible, swallows heard.  Rhythmic jaw movement seen with dimpling in cheeks.  Mom used breast massage and compression to keep infant active.  Milk was noted running out corner of mouth onto burp cloth and moms hand.  Infant actively fed for 50minutes and when self detached had milk on chin and around lips.    LC inquired about milk leaking from mouth when bottle feeding and dad said it does occasionally.    LC suggested dad finger feed remaining EBM in syringe to infant as mom needs a break due to not feeling well/headache.    LC discussed the need for an OP appointment to assess milk transfer and wt. Check and for further assessment of tongue restriction and inability to form a good seal at the breast.    Mom states she tried to make an appointment but was told she needed a referral from the  pediatrician. Mom called them, then called [place] again because no one at the clinic returned her call.     LC will follow up with process for OP appointment.    Mom is encouraged to continue pumping as infant in not consistently feeding well at the breast.  She knows this is to provide insurance for her milk supply.  LC discussed possibility of feeding with SNS with future feed and encouraged mom to call when she desired assistance or had questions.   Maternal Data    Feeding Feeding Type: Breast Fed  LATCH Score                   Interventions Interventions: Breast compression;Skin to skin;Breast massage;Hand express;Position options;Expressed milk;Adjust position;Support pillows;Breast feeding basics reviewed  Lactation Tools Discussed/Used Tools: Pump;Nipple Shields Nipple shield size: 24   Consult Status Consult Status: Follow-up Date: 09/25/19 Follow-up type: In-patient    Ferne Coe [facility] 09/24/2019, 1:33 PM

## 2019-09-25 LAB — GLUCOSE, CAPILLARY: Glucose-Capillary: 151 mg/dL — ABNORMAL HIGH (ref 70–99)

## 2019-09-25 NOTE — Plan of Care (Signed)
  Problem: Education: Goal: Knowledge of the prescribed therapeutic regimen will improve Outcome: Progressing   

## 2019-09-25 NOTE — Progress Notes (Signed)
PPD#9  Pt now off Mag. She states that she has no headaches, no vision changes. She has normal B/Ps with a rare mild elevation. She does not have an established neurologist at this time. She has been to [place] in the past. She treats her headaches with Excedrin and avoiding triggers.  IMP/ PP preeclampsia improving         Symptoms resolved Plan/Will discharge to home and followup next week

## 2019-09-25 NOTE — Discharge Summary (Signed)
  Pt is a 44 year old white female [id number] who was s/p SVD 6 days who presented to the hospital with headache and elevated b/ps. Please read admission H&P for complete details of admission.  Hosp Course: On admission pt was started on MgSO4 for 24 hours. Headache was txd with pain meds. She received no HTN meds and b/ps returned to normal. She had resolution of her headache while on MgSO4. She had nl labs. At time of discharge she had no headache or symptoms of PEC. She will be discharged to home and follow up in 5 days. She will check b/p BID and call with any headaches.

## 2019-09-25 NOTE — Progress Notes (Signed)
Pt. Requested verification of lactation plan as she wanted a referral for OP consult to review latch. Mickel Baas notified of pt. Request for lactation and will see pt.

## 2019-09-25 NOTE — Progress Notes (Signed)
At 08:36 NT Natalie Ramirez went to pt. Room to get fasting CBG. Pt. Stated to NT she had eaten at 06:30. This was prior to dayshift arrival per off going NT the patient did not call for her fasting to get checked prior to CBG check.

## 2019-09-25 NOTE — Plan of Care (Signed)
Discharge [place] reviewed with patient. She is aware of when to schedule her appt. And numbers to call. Pre-E s/s reviewed. All questions answered, instructions reviewed.

## 2019-09-29 ENCOUNTER — Ambulatory Visit: Payer: Self-pay

## 2019-09-29 NOTE — Lactation Note (Signed)
This note was copied from a baby's chart. Lactation Consultation Note  Patient Name: Natalie Ramirez [id number] Date: 09/29/2019     09/29/2019  Name: Natalie Ramirez MRN: [id number] Date of Birth: 09/16/2017 Gestational Age: Gestational Age: [redacted]w[redacted]d Birth Weight: 119.6 oz Weight today:  Weight: 7 lb 4.4 oz (3300 g)  18 day old ET infant presents today with mom for feeding assessment. Mom reports weight gain is less than what Pediatrician would like to see.   Infant has gained 20 grams in the last 10 days with an average daily weight gain of 2 grams a day. Infant is about 3 ounces below her birthweight today. Mom reports infant lowest weight was 6 pounds 14 ounces.   Mom awakens infant for most feeding, infant is starting to awaken herself to feed.   Reviewed with mom the ET infant and needing to limit BF to up to 30 minutes and offer bottles of pumped milk to infant. Once infant is more active she can BF more.  Mom tried the NS and was not wanting to try the SNS when offered previously. Infant not active at the breast enough today to try.   Infant with thick, wide labial frenulum with tight upper lip with flanging. Infant with high palate. Infant with short posterior lingual frenulum with tongue snapback with feeding and suckling on gloved finger.  Infant with decreased mid tongue elevation and extension. Reviewed how tongue and lip restrictions can effect milk transfer and milk supply. Reviewed reassessing infant as she gets closer to 40-42 weeks to see how feeding is going. Mom reports infant is gagging when on her back after feeding.   Mom did well with latching and supporting infant with feeding. Milk was flowing easily. Showed mom the asymmetrical latch to help. Mom reports latch was a little better. Infant did not feel well in the office today. Reviewed ET infant behavior with nursing.   Infant to follow up with Dr. Servando Salina at 1 month. Infant to follow up with Lactation in 1  week.  General Information: Mother's reason for visit: Feeding assessment, ET infant Consult: Initial Lactation consultant: Ivin Booty Ashyr Hedgepath RN,IBCLC Breastfeeding experience: not consistently BF well, mom attempts with each feeding Maternal medical conditions: Thyroid, Diabetes, Polycystic ovarian syndrome, Pregnancy induced hypertension, Infertility(Metformin and Insulin, partial Thyroidectomy for thyroid Cancer) Maternal medications: Pre-natal vitamin(Levothyroxine, Metformin, Excedrine Migraine)  Breastfeeding History: Frequency of breast feeding: attempts 2-3 hours Duration of feeding: 30 + minutes, not always active  Supplementation: Supplement method: bottle(Similac Slow flow Nipple)         Breast milk volume: 30-60 Breast milk frequency: 7-8   Pump type: Medela pump in style Pump frequency: 3 x a day Pump volume: 60-120 ml  Infant Output Assessment: Voids per 24 hours: 8 Urine color: Clear yellow Stools per 24 hours: 5 Stool color: Yellow  Breast Assessment: Breast: Soft, Compressible Nipple: Erect Pain level: 4(on left nipple) Pain interventions: Bra, Breast pump, Coconut oil  Feeding Assessment: Infant oral assessment: Variance Infant oral assessment comment: see note Positioning: Football(right breast, 20 minutes) Latch: 1 - Repeated attempts needed to sustain latch, nipple held in mouth throughout feeding, stimulation needed to elicit sucking reflex. Audible swallowing: 1 - A few with stimulation Type of nipple: 2 - Everted at rest and after stimulation Comfort: 1 - Filling, red/small blisters or bruises, mild/mod discomfort Hold: 1 - Assistance needed to correctly position infant at breast and maintain latch LATCH score: 6 Latch assessment: Deep Lips flanged: Yes Suck assessment:  Displays both Tools: Bottle Pre-feed weight: 3300 grams Post feed weight: 3310 grams Amount transferred: 10 ml Amount supplemented: 60 ml EBM via bottle  Additional Feeding  Assessment:                                    Totals: Total amount transferred: 10 ml Total supplement given: 60 ml EBM via bottle Total amount pumped post feed: did not pump   Plan:  1. Offer infant the breast with feeding cues, make sure infant has at least 8 feedings a day 2. Limit breast feeding to 30 minutes total 3. Keep infant awake with feedings 4. Massage/compress breast with feeding as needed to keep infant awake with feeding 5. Feed infant skin to skin 6. Offer both breasts with each feeding, empty the first breast before offering the second breast 7. Offer infant a bottle of pumped breast milk after every breast feeding 8. When offering the bottle, use the paced bottle feeding method (video on [email or web address].[email or web address]) 9. Infant needs about 62-83 ml (2-3 ounces) for 8 feedings a day or 495-660 ml (17-22 ounces) in 24 hours. Infant may take more or less each feeding depending on how often she feeds. Feed infant until she is satisfied. 10. Try the Dr. Saul Fordyce Level 1 nipple for feeding, if choking or drooling on the bottle try the Dr. Saul Fordyce Preemie nipple 11. Would recommend that you continue to pump 7-8 x a day for 15-20 minutes with your 'Double Electric breast pump to promote and protect your milk supply. Pump anytime infant is getting a bottle.  12. A hands free pumping bra may be helpful with pumping 13. Keep up the good work 26. Thank you for allowing me to assist you today 15. Please call with any questions or concerns as needed [phone] 16. Follow up with Lactation in 1 week  [place], [place] 09/29/2019, 1:27 PM

## 2019-10-06 ENCOUNTER — Ambulatory Visit: Payer: Self-pay

## 2019-10-06 NOTE — Lactation Note (Signed)
This note was copied from a baby's chart. Lactation Consultation Note  Patient Name: Natalie Ramirez [id number] Date: 10/06/2019     10/06/2019  Name: Natalie Ramirez MRN: [id number] Date of Birth: 09/16/2017 Gestational Age: Gestational Age: [redacted]w[redacted]d Birth Weight: 119.6 oz Weight today:  Weight: 7 lb 10.1 oz (7731 g)   69 week old ET infant presents today with mom for follow up feeding assessment.   Infant has gained 162 grams in the last 7 days with an average daily weight gain of 23 grams a day. Infant is 71 grams above birthweight.   Infant is showing increased energy and has been starting to latch to the breast better. Infant much more alert today as compared to last week.   Mom is pumping after more feedings. She has had to use some formula for supplementation.   Infant with thick, wide labial frenulum with tight upper lip with flanging. Infant with high palate. Infant with short posterior lingual frenulum with tongue snapback with feeding and suckling on gloved finger. Snapback is a little less noticeable today vs last week.   Infant with decreased mid tongue elevation and extension. Reviewed how tongue and lip restrictions can effect milk transfer and milk supply. Reviewed reassessing infant as she gets closer to 40-42 weeks to see how feeding is going. Mom reports infant is gagging when on her back after feeding. Infant is latching better, although she is pulling on and off the breast. We tried the # 24 NS and infant did sustain latch better. Infant is sleepy at the breast and mom is good with stimulating infant as needed with feeding. Infant with jaw quivering after feeding. Infant finished feeding with a bottle.  Reviewed again with mom that infant may still need a little more time to improve with feeding. Enc mom to continue to work with infant on the breast and to continue to keep her milk supply up for when infant is able to transfer better at the breast.   Reviewed application  of the NS and to use if it is helpful and to not use if it is stressful. Mom did well with applying the NS today. Infant did sustain latch better with the NS in place. Recommended mom still limit Bf time for now and continue supplementing infant after BF.   Infant to follow up with Dr. Servando Salina at 51 month of age. Infant to follow up with Lactation in 2 weeks.    General Information: Mother's reason for visit: Follow up feeding assessment Consult: Follow-up Lactation consultant: Nonah Mattes RN,IBCLC Breastfeeding experience: latching is improving, mom is supplementing with each feeding Maternal medical conditions: Thyroid, Polycystic ovarian syndrome, Pregnancy induced hypertension, Infertility(Metformin and Insulin, Partial Thyroidectomy due to Thyroid cancer) Maternal medications: Pre-natal vitamin, Other(Levothyroxine, Metformin, Excedring Migraine prn)  Breastfeeding History: Frequency of breast feeding: every 2-3 hours- offers breast first Duration of feeding: up to 30 minutes  Supplementation: Supplement method: bottle(Evenflo slow flow) Brand: Similac Formula volume: 1-1.5 ounces Formula frequency: a few times this week   Breast milk volume: 2-3 ounces Breast milk frequency: 2-3 ounces   Pump type: Medela pump in style Pump frequency: 4-5 x a day Pump volume: < 1-3.5 ounces  Infant Output Assessment: Voids per 24 hours: 8+ Urine color: Clear yellow Stools per 24 hours: every other day, stoll has decreased since starting formula Stool color: Yellow  Breast Assessment: Breast: Soft, Compressible Nipple: Erect, Other(bleb to left nipple) Pain level: 5 Pain interventions: Bra, Expressed breast milk, Breast  pump  Feeding Assessment: Infant oral assessment: Variance Infant oral assessment comment: see note Positioning: Football(right breast and left breast , 20 minutes) Latch: 1 - Repeated attempts needed to sustain latch, nipple held in mouth throughout feeding,  stimulation needed to elicit sucking reflex. Audible swallowing: 1 - A few with stimulation Type of nipple: 2 - Everted at rest and after stimulation Comfort: 1 - Filling, red/small blisters or bruises, mild/mod discomfort Hold: 1 - Assistance needed to correctly position infant at breast and maintain latch LATCH score: 6 Latch assessment: Deep Lips flanged: Yes Suck assessment: Displays both Tools: Nipple shield 24 mm(# 24 NS with half of feeding) Pre-feed weight: 3462 grams Post feed weight: 3480 grams Amount transferred: 18 ml Amount supplemented: 30 ml EBM via bottle  Additional Feeding Assessment:                                    Totals: Total amount transferred: 18 ml Total supplement given: 30 ml EBM via bottle Total amount pumped post feed: did not pump   Plan:   1. Offer infant the breast with feeding cues, make sure infant has at least 8 feedings a day 2. Limit breast feeding to 30 minutes total 3. Keep infant awake with feedings 4. Massage/compress breast with feeding as needed to keep infant awake with feeding 5. Feed infant skin to skin 6. Offer both breasts with each feeding, empty the first breast before offering the second breast 7. Offer infant the bottle with about an ounce of breast milk and then latch to the breast if she is frantic at the breast and will not latch 8. Offer infant a bottle of pumped breast milk or formula after every breast feeding until infant is more active and emptying the breast better.  9. When offering the bottle, use the paced bottle feeding method (video on [email or web address].[email or web address]) 10. Infant needs about 64-85 ml (2-3 ounces) for 8 feedings a day or 510-680 ml (17-22 ounces) in 24 hours. Infant may take more or less each feeding depending on how often she feeds. Feed infant until she is satisfied. 11. Try the Dr. Saul Fordyce Level 1 nipple for feeding, if choking or drooling on the bottle try the Dr. Saul Fordyce Preemie nipple 12. Would  recommend that you continue to pump 7-8 x a day for 15-20 minutes with your 'Double Electric breast pump to promote and protect your milk supply. Pump anytime infant is getting a bottle.  13. A hands free pumping bra may be helpful with pumping 14. Warm compresses to the left nipple and rub over the area with a washcloth to remove the bleb that is present 15. Keep up the good work 87. Thank you for allowing me to assist you today 17. Please call with any questions or concerns as needed [phone] 18. Follow up with Lactation in 2 weeks  [employer], IBCLC                                                      Donn Pierini 10/06/2019, 10:39 AM

## 2019-10-20 ENCOUNTER — Ambulatory Visit: Payer: Self-pay

## 2019-10-20 NOTE — Lactation Note (Signed)
This note was copied from a baby's chart. Lactation Consultation Note  Patient Name: Natalie Ramirez [id number] Date: 10/20/2019     10/20/2019  Name: Natalie Ramirez MRN: [id number] Date of Birth: 09/16/2017 Gestational Age: Gestational Age: [redacted]w[redacted]d Birth Weight: 119.6 oz Weight today:  Weight: 8 lb 3.9 oz (9277 g)  54 week old ET infant presents today with mom for follow up feeding assessment.   Infant has gained 278 grams in the last 14 days with an average daily weight gain of 20 grams a day.   Mom has been setting the timer at 3 hours. Infant sometimes wakes at 2.5 hours and will occasionally go 4 hours.   Mom reports her supply is up. She is about 6-7 feedings ahead of infant. Mom pleased with her milk supply.   Infant with thick, wide labial frenulum with tight upper lip with flanging. Infant with high palate. Infant with short posterior lingual frenulum with tongue snapback with feeding and suckling on gloved finger. Snapback present when suckling on gloved finger.Infant with decreased mid tongue elevation and extension. Reviewed how tongue and lip restrictions can effect milk transfer and milk supply.  Mom reports infant is gagging when on her back after feeding. Infant is crying for longer periods in the evening and does not tolerate laying on her back well after feeding. Infant burping and crying after burping after feedings.  Infant is latching better, although she is pulling on and off the breast. We tried the # 24 NS and infant did sustain latch better, mom reports she is not seeing milk in the NS when infant has been using it at home recently. Infant is sleepy at the breast and mom is good with stimulating infant as needed with feeding. Infant with jaw quivering after feeding. Infant is not stooling well, abdomen is soft and compressible.   Mom latched infant to the right breast in the football and cross cradle hold and was not able to sustain latch despite several attempts.  Infant did latch easier to the left breast. She was very sleepy at the breast and did not suckle for very long at a time. Infant self detached. Nipple was rounded post feeding. It took about 20-30 minutes to get infant to get into a rhythmic pattern. Mom did very well with latching, supporting and stimulating infant during BF.   Enc mom to continue working on BF and to give infant a little more time to figure out how to BF.   Mom reports infant is fussy and crying in the evening, She cries between feedings. Reviewed purple crying and that it is not unusual to see up until about 29-61 weeks of age. Mom has spoken with Peds about it.   Mom resolved one bleb and now has another. She is working to get it open with warm compresses, massage, and pumping. No s/s mastitis.   Infant to follow up with Dr. Servando Salina on Friday 2/25. Infant to follow up with Lactation in 2 weeks . Mom reports that she is participating in an online BF Support group and it is helpful for her.    General Information: Mother's reason for visit: Follow up feeding assessment Consult: Follow-up Lactation consultant: Nonah Mattes RN,IBCLC Breastfeeding experience: BF is impoving, infant still needing supplement Maternal medical conditions: Thyroid, Polycystic ovarian syndrome, Pregnancy induced hypertension, Infertility, Other(Parital Thyroidectomy due to Thyroid Cancer) Maternal medications: Pre-natal vitamin, Other(Levothyroxine and Meformin)  Breastfeeding History: Frequency of breast feeding: every 2-5-4 hours, Attempts each time, has  a few good BF in a week Duration of feeding: 5-30 minutes  Supplementation: Supplement method: bottle(Evenflo, tolerates well with no choking or drooling)         Breast milk volume: 2-3 ounces Breast milk frequency: every 2-4 hours   Pump type: Medela pump in style Pump frequency: 6 x a day Pump volume: 2-4 ounces  Infant Output Assessment: Voids per 24 hours: 8+ Urine color: Clear  yellow Stools per 24 hours: 1-2 a week, large volumes Stool color: Yellow  Breast Assessment: Breast: Soft, Compressible Nipple: Erect Pain level: 2(with latch at times, pain with bleb) Pain interventions: Bra, Breast pump  Feeding Assessment: Infant oral assessment: Variance Infant oral assessment comment: see note Positioning: Cross cradle Latch: 1 - Repeated attempts needed to sustain latch, nipple held in mouth throughout feeding, stimulation needed to elicit sucking reflex. Audible swallowing: 1 - A few with stimulation Type of nipple: 2 - Everted at rest and after stimulation Comfort: 1 - Filling, red/small blisters or bruises, mild/mod discomfort Hold: 2 - No assistance needed to correctly position infant at breast LATCH score: 7 Latch assessment: Deep Lips flanged: Yes Suck assessment: Displays both(Both breasts, 1 hour, active for about 15-20 minutes)   Pre-feed weight: 3740 grams Post feed weight: 3786 grams Amount transferred: 46 ml Amount supplemented: 0  Additional Feeding Assessment:                                    Totals: Total amount transferred: 46 ml Total supplement given: mom to supplement Total amount pumped post feed: did not pump   Plan:  1. Offer infant the breast with feeding cues, make sure infant has at least 8 feedings a day 2. Limit breast feeding to 30 minutes total until she is more active at the breast 3. Keep infant awake with feedings 4. Massage/compress breast with feeding as needed to keep infant awake with feeding 5. Feed infant skin to skin 6. Offer both breasts with each feeding, empty the first breast before offering the second breast 7. Offer infant the bottle with about an ounce of breast milk and then latch to the breast if she is frantic at the breast and will not latch 8. Offer infant a bottle of pumped breast milk or formula after every breast feeding until infant is more active and emptying the breast  better.  9. When offering the bottle, use the paced bottle feeding method (video on [email or web address].[email or web address]) 10. Infant needs about 69-93 ml (2.5-3 ounces) for 8 feedings a day or 55-740 ml (19-25 ounces) in 24 hours. Infant may take more or less each feeding depending on how often she feeds. Feed infant until she is satisfied. 11. Try the Dr. Saul Fordyce Level 1 nipple for feeding, if choking or drooling on the bottle try the Dr. Saul Fordyce Preemie nipple 12. Would recommend that you continue to pump 6-8 x a day for 15-20 minutes with your Double Electric breast pump to promote and protect your milk supply. Pump anytime infant is getting a bottle.  13. A hands free pumping bra may be helpful with pumping 14. Warm compresses to the left nipple and rub over the area with a washcloth to remove the bleb that is present 15. Keep up the good work 69. Thank you for allowing me to assist you today 17. Please call with any questions or concerns as needed [phone] 18. Follow up with Lactation  in 2 weeks    [place], IBCLC                                                   Debby Freiberg Aniesha Haughn 10/20/2019, 10:34 AM

## 2019-11-03 ENCOUNTER — Ambulatory Visit: Payer: Self-pay

## 2019-11-03 NOTE — Lactation Note (Signed)
This note was copied from a baby's chart. Lactation Consultation Note  Patient Name: Natalie Ramirez [id number] Date: 11/03/2019     11/03/2019  Name: Natalie Ramirez MRN: [id number] Date of Birth: 09/16/2017 Gestational Age: Gestational Age: [redacted]w[redacted]d Birth Weight: 119.6 oz Weight today:  Weight: 8 lb 14.3 oz (7180 g)  46 week old infant presents today with mom for follow up feeding assessment.   Infant has gained 294 grams in the last 14 days with an average daily weight gain of 21 grams a day. This is about the average that infant has been growing on. Mom was informed that this is still a little lower than normal for this age. Reviewed continuing supplement as she has been and increasing supplement as infant wants. Infant tends to spit more when they increase her volumes.   Infant is feeding on demand and is BF first and then offering the bottle during the day. Mom is offering the bottle first and then offering the breast at night. Infant has improved on her breast feeding.   Infant is feeding using the Evenflo slow flow nipple for bottle feeding. Mom reports she is more efficient. Mom reports sometimes infant is really fast on the bottle and will pull on and off the bottle towards the end of the feeding. Reviewed paced bottle feeding with mom.   Infant with thick, wide labial frenulum with tight upper lip with flanging. Infant with high palate. Infant with short posterior lingual frenulum with tongue snapback with feeding and suckling on gloved finger.Snapback present when suckling on gloved finger.Infant with decreased mid tongue elevation and extension.  Mom reports infant is gagging when on her back after feeding, with more spitting noted now. Infant is crying for longer periods in the evening and does not tolerate laying on her back well after feeding. Infant burping and crying after burping after feeding. Infant is latching better, although she is pulling on and off the breast. Infant is  more alert at the breast and mom is good with stimulating infant as needed with feeding. Infant with jaw quivering after feeding. Infant is not stooling well, abdomen is soft and compressible. Mom has information on tongue and lip restrictions. She and dad have done some research. They have not decided if they want infant evaluated. Reviewed infant is showing several signs of tongue restriction and may benefit from being evaluated by oral specialist. Mom reports they feel infant is feeding better at the breast and pleased with the progress.   Infant latched to both breasts. She was alert and feeding on and off. She pulled on and off the breast. She maintained latch better once milk flowing.  Mom with some pain with latch on the left side with initial latch.   Mom is wanting to decrease pumping, reviewed with mom that it is recommended that she pump anytime infant is needing a bottle to maintain supply over time.   Mom still has a bleb to the right breast that comes and goes. She is removing the skin as needed and gets the duct flowing again. She has had some plugged ducts that release with pumping. Reviewed Sunflower Lecithin to see if that helps.    Reviewed Legendairy Milk Fenugreek Products and Reglan with mom including Side effects. Mom with Thyroid issues so Fenugreek not recommended.   Infant pulled on and off the breast with feeding. Mom did well with latching infant. Infant frustrated and pulled on and off the breast initially and then when milk  started flowing she would maintain latch better. Infant finished feeding with the bottle. Infant gagging a little on the bottle.   Infant to follow up with Dr. Servando Salina at 2 months. Infant to follow up with Lactation as needed at mom's requestor 1-5 days post tongue and lip releases if completed.      General Information: Mother's reason for visit: Follow up feeding assessment Consult: Follow-up Lactation consultant: Nonah Mattes  RN,IBCLC Breastfeeding experience: Feeding on demand and nursing better Maternal medical conditions: Thyroid, Polycystic ovarian syndrome, Pregnancy induced hypertension, Infertility(Partial Thyroidectomy) Maternal medications: Pre-natal vitamin, Other(Levothyroxine, Metformin, Progesterone Only OCP)  Breastfeeding History: Frequency of breast feeding: 5-7 times a day on demand Duration of feeding: up to 1-1.5 hours, changes to non nutritive  Supplementation: Supplement method: bottle         Breast milk volume: 1-2 ounces Breast milk frequency: with each feeding   Pump type: Medela pump in style Pump frequency: Mom has decreased pumping, 3-5 times a day Pump volume: 4-6 ounces  Infant Output Assessment: Voids per 24 hours: 8+ Urine color: Clear yellow Stools per 24 hours: 1-2/week Stool color: Yellow  Breast Assessment: Breast: Soft, Compressible Nipple: Erect Pain level: 2 Pain interventions: Bra, Breast pump  Feeding Assessment: Infant oral assessment: Variance Infant oral assessment comment: see note Positioning: Cross cradle(left and right breast, 30 minutes) Latch: 1 - Repeated attempts needed to sustain latch, nipple held in mouth throughout feeding, stimulation needed to elicit sucking reflex. Audible swallowing: 1 - A few with stimulation Type of nipple: 2 - Everted at rest and after stimulation Comfort: 1 - Filling, red/small blisters or bruises, mild/mod discomfort Hold: 2 - No assistance needed to correctly position infant at breast LATCH score: 7 Latch assessment: Deep Lips flanged: Yes Suck assessment: Displays both   Pre-feed weight: 4034 grams Post feed weight: 4080 grams Amount transferred: 46 ml Amount supplemented: 50 ml EBM via bottle  Additional Feeding Assessment:                                    Totals: Total amount transferred: 46 ml Total supplement given: 50 ml EBM via bottle Total amount pumped post feed: did not  pump   Plan:  1. Offer infant the breast with feeding cues 2. Massage/compress breast with feeding as needed to keep infant awake with feeding 3. Feed infant skin to skin 4. Offer both breasts with each feeding, empty the first breast before offering the second breast 5. Offer infant the bottle with about an ounce of breast milk and then latch to the breast if she is frantic at the breast and will not latch 6. Offer infant a bottle of pumped breast milk or formula after every breast feeding until infant is more active and emptying the breast better.  7. When offering the bottle, use the paced bottle feeding method (video on kellymom.com) 8. Infant needs about 75-100 ml (2.5-3.5 ounces) for 8 feedings a day or 600-800 ml (20-27 ounces) in 24 hours. Infant may take more or less each feeding depending on how often she feeds. Feed infant until she is satisfied. 9. Would recommend that you continue to pump 4-6 x a day for 15-20 minutes with your [employer] breast pump to promote and protect your milk supply. Pump anytime infant is getting a bottle.  10. Sunflower Lecithin may be helpful.The usual dosage is 1200 mg 4 times a day to  start and then decrease to 1200 mg twice a day once plugs stop. Some women prefer to take 2400 mg twice a day instead. Take with food.  11. Some products that has helped mom's with milk production are:  Legendairy Milk Fenugreek Free Products- http://morrow.com/. There are several to choose from Reglan 10 mg 3 times a day (must be ordered by OB or PCP) Side effects are depression and Tardive Diskinesia. Some doctors will not prescribe due to side effects.  12. Keep up the good work 8. Thank you for allowing me to assist you today 14. Please call with any questions or concerns as needed [phone] 15. Follow up with Lactation as needed or 1-5 days post tongue and lip releases if completed   [facility] RN,  IBCLC                                                     Natalie Ramirez 11/03/2019, 10:12 AM

## 2019-12-08 ENCOUNTER — Encounter: Payer: Self-pay | Admitting: *Deleted

## 2020-03-31 IMAGING — US US OB TRANSVAGINAL
1 series · 15 of 28 positions shown · non-contrast
Comparison: None.

CLINICAL DATA: Vaginal bleeding, quantitative HCG of 877

EXAM:
OBSTETRIC <14 WK US AND TRANSVAGINAL OB US
TECHNIQUE: Both transabdominal and transvaginal ultrasound examinations were
performed for complete evaluation of the gestation as well as the
maternal uterus, adnexal regions, and pelvic cul-de-sac.
Transvaginal technique was performed to assess early pregnancy.

[Series 1: us ob transvaginal · 15 of 54 slices shown]
[im 1/54]
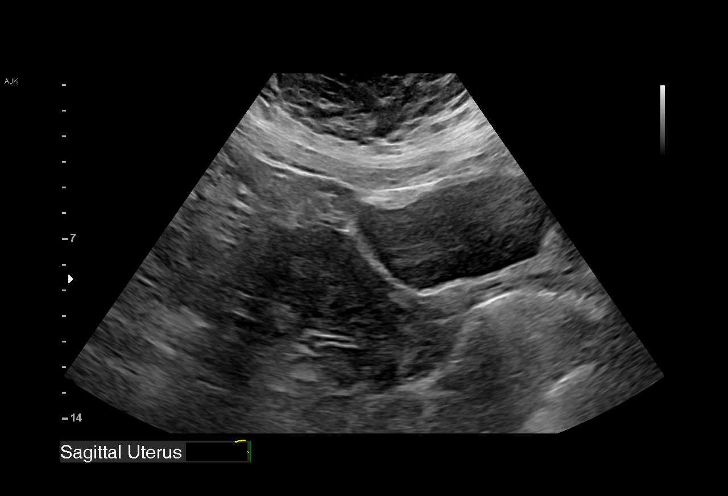
[im 4/54]
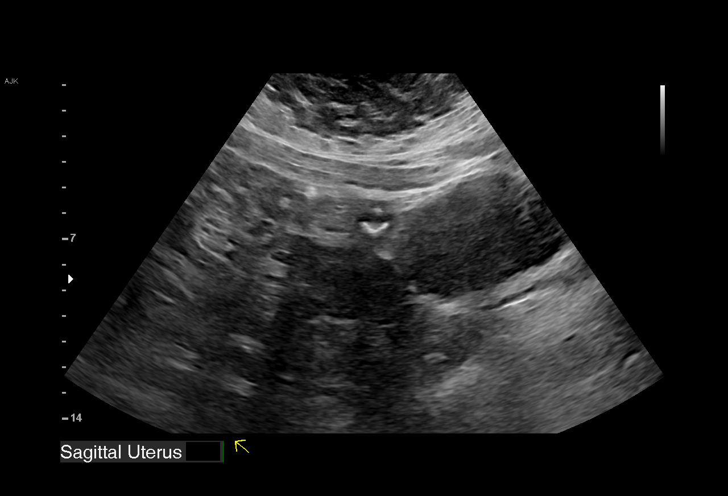
[im 8/54]
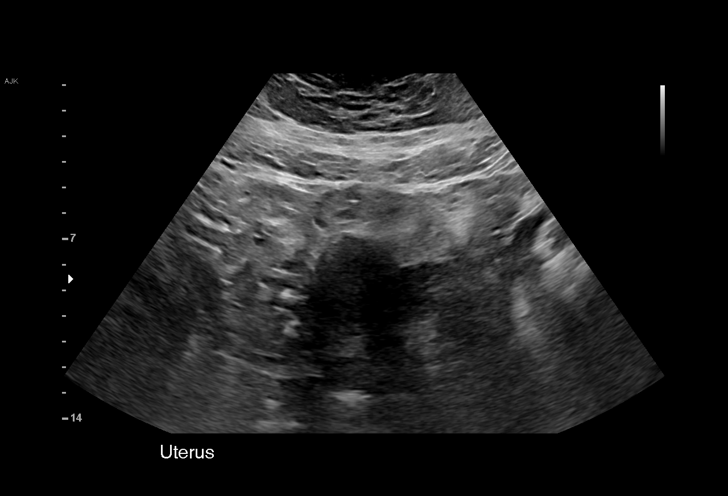
[im 12/54]
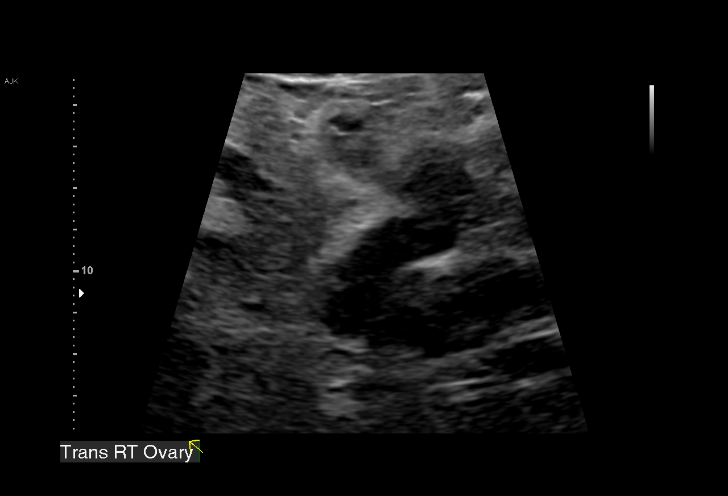
[im 16/54]
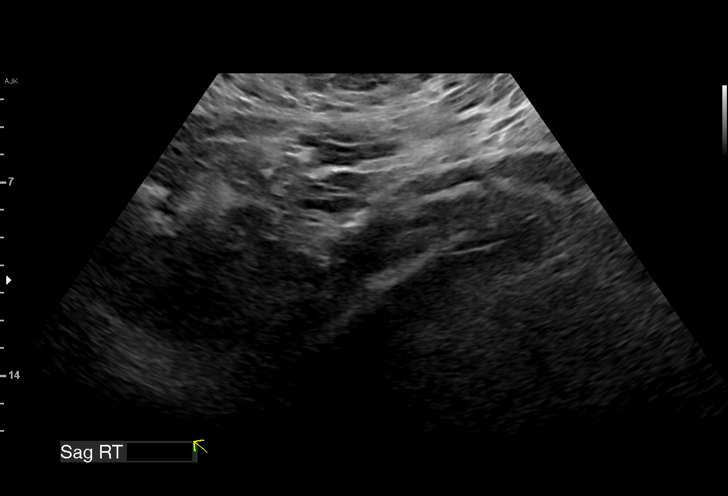
[im 20/54]
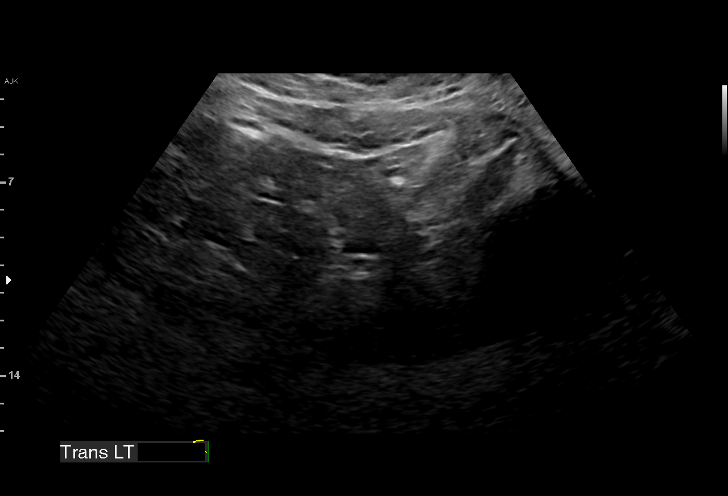
[im 24/54]
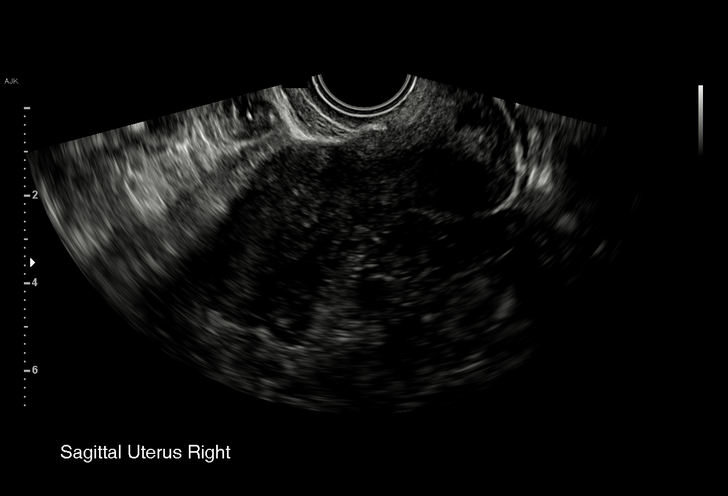
[im 28/54]
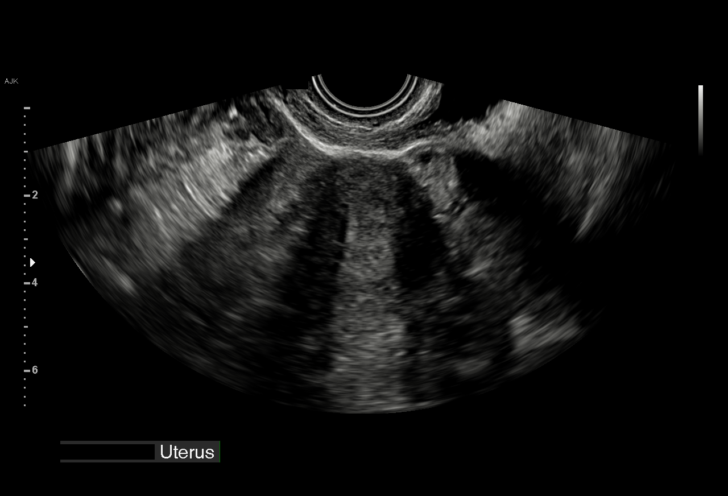
[im 30/54]
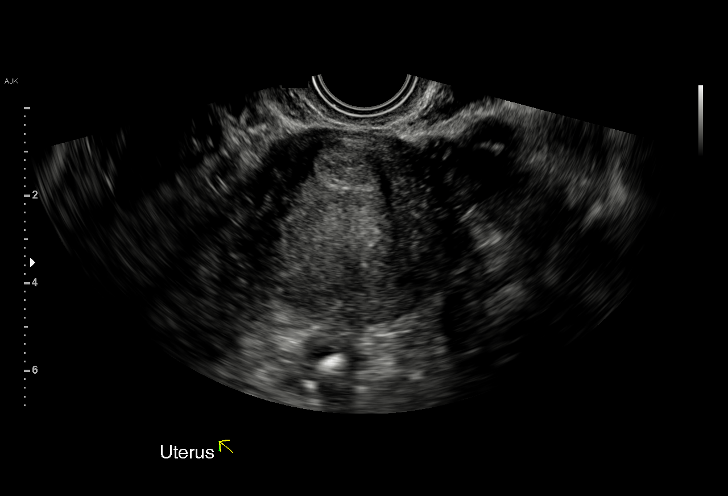
[im 34/54]
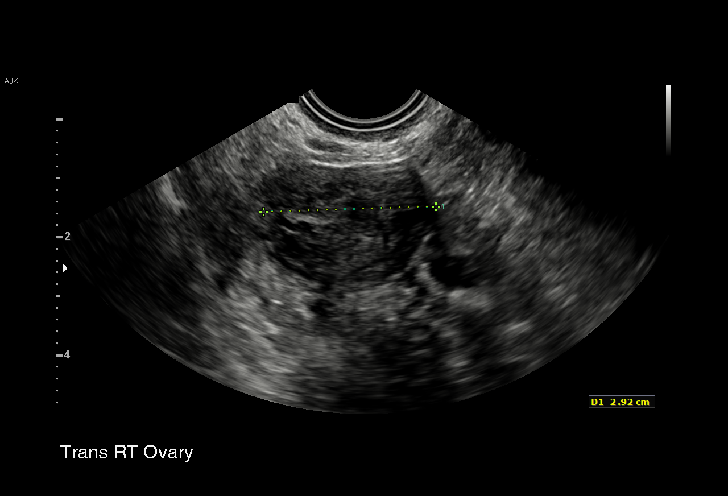
[im 38/54]
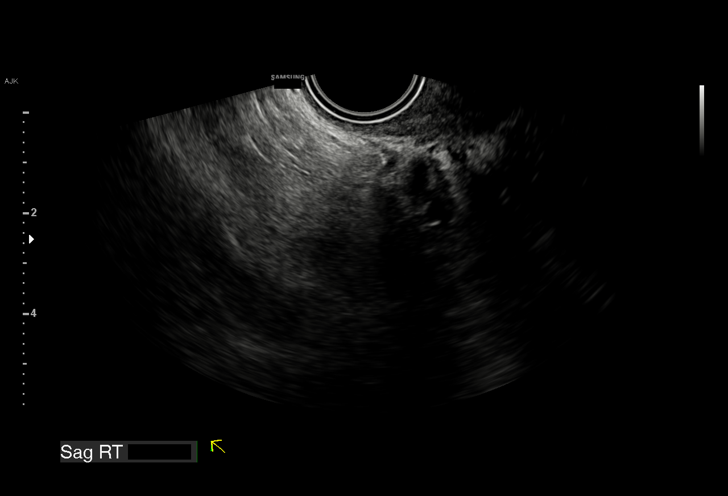
[im 42/54]
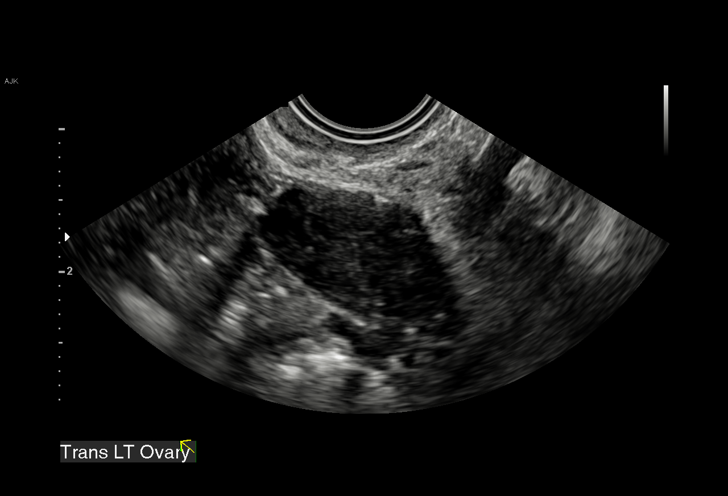
[im 46/54]
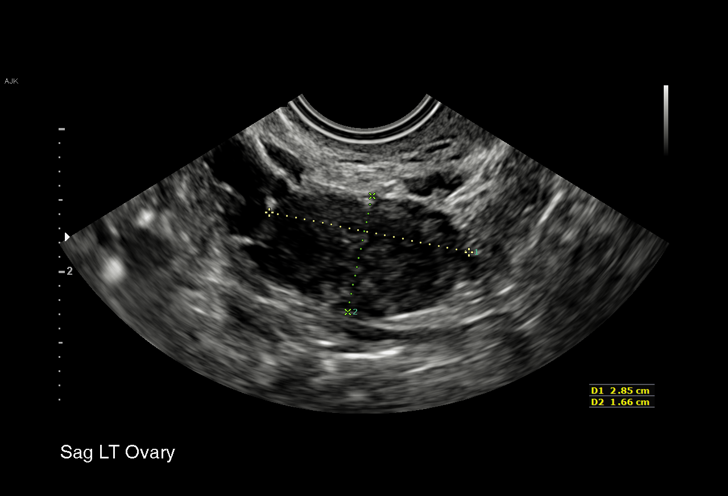
[im 50/54]
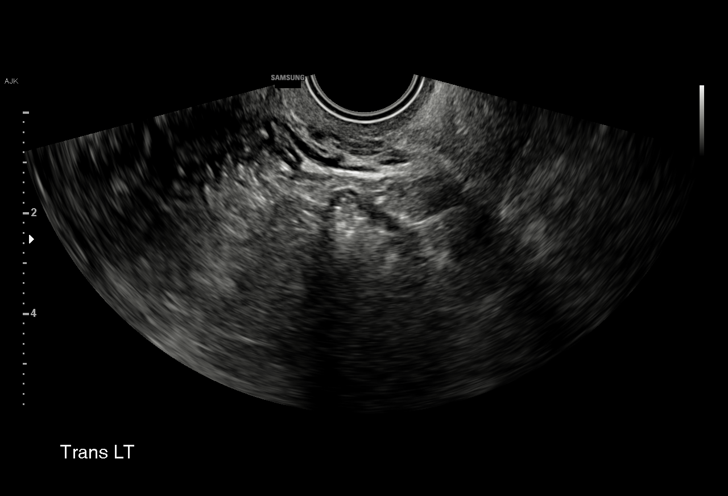
[im 54/54]
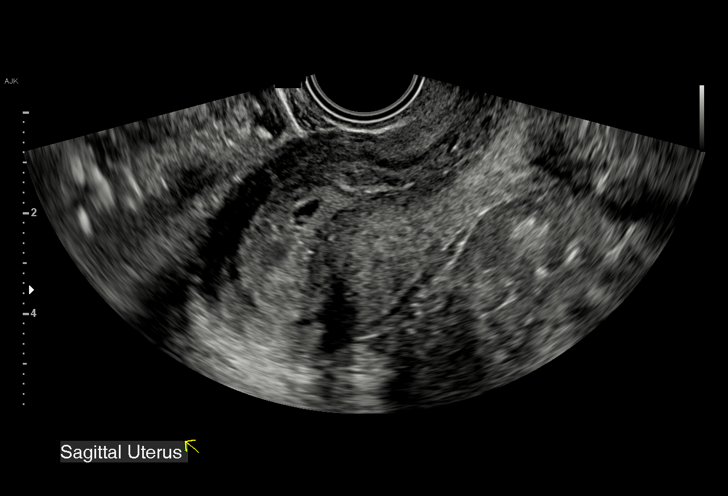

[15 of 28 positions shown; findings below may reference images not displayed]

FINDINGS: Intrauterine gestational sac: Questionable intrauterine gestational
sac

Yolk sac:  Not seen

Embryo:  Not seen

MSD: 4.1 mm   5 w   1 d

Subchorionic hemorrhage:  None visualized.

Maternal uterus/adnexae: Ovaries are within normal limits. Right
ovary measures 2.8 x 2.4 x 2.9 cm. Left ovary measures 2.9 x 1.7 x
2.5 cm. No significant free fluid.
IMPRESSION: Questionable early intrauterine gestational sac, but no yolk sac,
fetal pole, or cardiac activity yet visualized. Recommend follow-up
quantitative B-HCG levels and follow-up US in 14 days to assess
viability. This recommendation follows SRU consensus guidelines:
Diagnostic Criteria for Nonviable Pregnancy Early in the First
Trimester. N Engl J Med 0332; [DATE].

## 2021-04-24 ENCOUNTER — Other Ambulatory Visit: Payer: Self-pay | Admitting: Family Medicine

## 2021-04-24 LAB — SARS CORONAVIRUS 2 (TAT 6-24 HRS): SARS Coronavirus 2: NEGATIVE

## 2023-09-08 ENCOUNTER — Other Ambulatory Visit: Payer: Self-pay | Admitting: Obstetrics and Gynecology

## 2023-09-08 DIAGNOSIS — N6310 Unspecified lump in the right breast, unspecified quadrant: Secondary | ICD-10-CM

## 2023-09-29 ENCOUNTER — Other Ambulatory Visit: Payer: Self-pay

## 2023-11-21 ENCOUNTER — Encounter (HOSPITAL_BASED_OUTPATIENT_CLINIC_OR_DEPARTMENT_OTHER): Payer: Self-pay | Admitting: Emergency Medicine

## 2023-11-21 ENCOUNTER — Emergency Department (HOSPITAL_BASED_OUTPATIENT_CLINIC_OR_DEPARTMENT_OTHER)
Admission: EM | Admit: 2023-11-21 | Discharge: 2023-11-22 | Disposition: A | Discharge status: Home / Self Care | Attending: Emergency Medicine | Admitting: Emergency Medicine

## 2023-11-21 ENCOUNTER — Other Ambulatory Visit: Payer: Self-pay

## 2023-11-21 ENCOUNTER — Emergency Department (HOSPITAL_BASED_OUTPATIENT_CLINIC_OR_DEPARTMENT_OTHER)

## 2023-11-21 DIAGNOSIS — Z7982 Long term (current) use of aspirin: Secondary | ICD-10-CM | POA: Diagnosis not present

## 2023-11-21 DIAGNOSIS — E119 Type 2 diabetes mellitus without complications: Secondary | ICD-10-CM | POA: Diagnosis not present

## 2023-11-21 DIAGNOSIS — Z79899 Other long term (current) drug therapy: Secondary | ICD-10-CM | POA: Insufficient documentation

## 2023-11-21 DIAGNOSIS — R197 Diarrhea, unspecified: Secondary | ICD-10-CM | POA: Diagnosis not present

## 2023-11-21 DIAGNOSIS — R1013 Epigastric pain: Secondary | ICD-10-CM | POA: Diagnosis present

## 2023-11-21 DIAGNOSIS — R112 Nausea with vomiting, unspecified: Secondary | ICD-10-CM | POA: Insufficient documentation

## 2023-11-21 DIAGNOSIS — Z85850 Personal history of malignant neoplasm of thyroid: Secondary | ICD-10-CM | POA: Insufficient documentation

## 2023-11-21 DIAGNOSIS — Z7984 Long term (current) use of oral hypoglycemic drugs: Secondary | ICD-10-CM | POA: Diagnosis not present

## 2023-11-21 DIAGNOSIS — R1084 Generalized abdominal pain: Secondary | ICD-10-CM | POA: Diagnosis not present

## 2023-11-21 DIAGNOSIS — R030 Elevated blood-pressure reading, without diagnosis of hypertension: Secondary | ICD-10-CM | POA: Diagnosis not present

## 2023-11-21 LAB — COMPREHENSIVE METABOLIC PANEL WITH GFR
ALT: 16 U/L (ref 0–44)
AST: 14 U/L — ABNORMAL LOW (ref 15–41)
Albumin: 3.9 g/dL (ref 3.5–5.0)
Alkaline Phosphatase: 49 U/L (ref 38–126)
Anion gap: 8 (ref 5–15)
BUN: 16 mg/dL (ref 6–20)
CO2: 24 mmol/L (ref 22–32)
Calcium: 8.4 mg/dL — ABNORMAL LOW (ref 8.9–10.3)
Chloride: 106 mmol/L (ref 98–111)
Creatinine, Ser: 0.67 mg/dL (ref 0.44–1.00)
GFR, Estimated: >60 mL/min (ref >60)
Glucose, Bld: 140 mg/dL — ABNORMAL HIGH (ref 70–99)
Potassium: 3.7 mmol/L (ref 3.5–5.1)
Sodium: 138 mmol/L (ref 135–145)
Total Bilirubin: 0.3 mg/dL (ref 0.0–1.2)
Total Protein: 6.7 g/dL (ref 6.5–8.1)

## 2023-11-21 LAB — URINALYSIS, ROUTINE W REFLEX MICROSCOPIC
Bilirubin Urine: NEGATIVE
Glucose, UA: NEGATIVE mg/dL
Hgb urine dipstick: NEGATIVE
Ketones, ur: 40 mg/dL — AB
Nitrite: NEGATIVE
Protein, ur: 30 mg/dL — AB
Specific Gravity, Urine: 1.038 — ABNORMAL HIGH (ref 1.005–1.030)
pH: 6 (ref 5.0–8.0)

## 2023-11-21 LAB — CBC
HCT: 38.1 % (ref 36.0–46.0)
Hemoglobin: 12.5 g/dL (ref 12.0–15.0)
MCH: 28 pg (ref 26.0–34.0)
MCHC: 32.8 g/dL (ref 30.0–36.0)
MCV: 85.4 fL (ref 80.0–100.0)
Platelets: 291 10*3/uL (ref 150–400)
RBC: 4.46 MIL/uL (ref 3.87–5.11)
RDW: 13.3 % (ref 11.5–15.5)
WBC: 13.4 10*3/uL — ABNORMAL HIGH (ref 4.0–10.5)
nRBC: 0 % (ref 0.0–0.2)

## 2023-11-21 LAB — PREGNANCY, URINE: Preg Test, Ur: NEGATIVE

## 2023-11-21 LAB — LIPASE, BLOOD: Lipase: 27 U/L (ref 11–51)

## 2023-11-21 MED ORDER — FAMOTIDINE 20 MG PO TABS: 20.0000 mg | ORAL_TABLET | Freq: Two times a day (BID) | ORAL | 0 refills | Status: AC

## 2023-11-21 MED ORDER — FAMOTIDINE IN NACL 20-0.9 MG/50ML-% IV SOLN
20.0000 mg | Freq: Once | INTRAVENOUS | Status: AC
  Administered 2023-11-21: 20 mg via INTRAVENOUS
  Filled 2023-11-21: qty 50

## 2023-11-21 MED ORDER — PANTOPRAZOLE SODIUM 40 MG PO TBEC: 40.0000 mg | DELAYED_RELEASE_TABLET | Freq: Every day | ORAL | 0 refills | Status: AC

## 2023-11-21 MED ORDER — SODIUM CHLORIDE 0.9 % IV BOLUS
1000.0000 mL | Freq: Once | INTRAVENOUS | Status: AC
  Administered 2023-11-21: 1000 mL via INTRAVENOUS

## 2023-11-21 MED ORDER — IOHEXOL 300 MG/ML  SOLN
100.0000 mL | Freq: Once | INTRAMUSCULAR | Status: AC | PRN
  Administered 2023-11-21: 100 mL via INTRAVENOUS

## 2023-11-21 MED ORDER — PANTOPRAZOLE SODIUM 40 MG IV SOLR
40.0000 mg | Freq: Once | INTRAVENOUS | Status: AC
  Administered 2023-11-21: 40 mg via INTRAVENOUS
  Filled 2023-11-21: qty 10

## 2023-11-21 MED ORDER — ONDANSETRON 4 MG PO TBDP: 4.0000 mg | ORAL_TABLET | Freq: Three times a day (TID) | ORAL | 0 refills | Status: AC | PRN

## 2023-11-21 NOTE — Discharge Instructions (Addendum)
 You were seen today for epigastric abdominal pain with nausea and vomiting that is worse after meals.  I suspect this is most likely due to GERD or gastroparesis from your monitor but can also be development of a peptic ulcer or gastric ulcer although this less likely with no history of H. pylori or NSAID use or chronic alcohol use.  Recommend that you continue take the medications I have provided for you today which are pending the Protonix and famotidine.  Famotidine you can take as needed for pain, Protonix you are going to take daily.  Will also provide you Zofran which can take every 8 hours for nausea.  Recommend you follow-up with GI for further evaluation.  Provided their information here for you to call their office and schedule a visit.  Return to the ED if you begin to have any new or worsening symptoms including worsening abdominal pain that is unrelenting, unable to tolerate food or fluid despite Zofran, fever, shortness of breath, blood in stool or vomit.

## 2023-11-21 NOTE — ED Provider Notes (Signed)
 [place] EMERGENCY DEPARTMENT AT [facility] Provider Note   CSN: [id number] Arrival date & time: 11/21/23  1916     History  Chief Complaint  Patient presents with   Abdominal Pain    Natalie Ramirez is a 48 y.o. female.   Abdominal Pain Associated symptoms: diarrhea, nausea and vomiting    Patient is a 48 year old female presents the ED today complaining of epigastric abdominal pain that radiates to the rest of her abdomen that has been intermittent x 9 days and accompanied with nausea, vomiting, diarrhea that have been particularly malodorous.  Also reporting episodes of "turning yellow".  Reports that the pain is significantly worse approximately 30 to 60 minutes after meals.  Previous medical history of diabetes currently taking Mounjaro, thyroid cancer.  States that she had been seen by her [facility] physicians today and told to come to the ED for dehydration.  Denies alcohol use, chronic NSAID use.  Denies fever, headache, vision changes, chest pain, shortness of breath, dysuria, hematochezia, melena, vaginal discharge, vaginal bleeding, lower leg swelling.    Home Medications Prior to Admission medications   Medication Sig Start Date End Date Taking? Authorizing Provider  famotidine (PEPCID) 20 MG tablet Take 1 tablet (20 mg total) by mouth 2 (two) times daily. 11/21/23  Yes Lunette Stands, PA-C  ondansetron (ZOFRAN-ODT) 4 MG disintegrating tablet Take 1 tablet (4 mg total) by mouth every 8 (eight) hours as needed for nausea or vomiting. 11/21/23  Yes Lunette Stands, PA-C  pantoprazole (PROTONIX) 40 MG tablet Take 1 tablet (40 mg total) by mouth daily. 11/21/23  Yes Lunette Stands, PA-C  aspirin-acetaminophen-caffeine (EXCEDRIN MIGRAINE) [phone] MG tablet Take 2 tablets by mouth every 6 (six) hours as needed for headache.    [provider]  levothyroxine (SYNTHROID) 100 MCG tablet Take 100 mcg by mouth every morning. 08/06/19   [provider]   metFORMIN (GLUCOPHAGE-XR) 500 MG 24 hr tablet Take 2,000 mg by mouth daily. 07/19/19   [provider]  Prenatal Vit-Fe Fumarate-FA (PRENATAL MULTIVITAMIN) TABS tablet Take 1 tablet by mouth at bedtime.     [provider]      Allergies    Patient has no known allergies.    Review of Systems   Review of Systems  Gastrointestinal:  Positive for abdominal pain, diarrhea, nausea and vomiting.  All other systems reviewed and are negative.   Physical Exam Updated Vital Signs BP 120/69   Pulse 82   Temp 99 F (37.2 C) (Oral)   Resp 16   SpO2 96%  Physical Exam Vitals and nursing note reviewed.  Constitutional:      General: She is not in acute distress.    Appearance: Normal appearance. She is well-developed. She is not ill-appearing.  HENT:     Head: Normocephalic and atraumatic.     Mouth/Throat:     Mouth: Mucous membranes are moist.     Pharynx: Oropharynx is clear. No oropharyngeal exudate or posterior oropharyngeal erythema.  Eyes:     General: No scleral icterus.       Right eye: No discharge.        Left eye: No discharge.     Extraocular Movements: Extraocular movements intact.     Conjunctiva/sclera: Conjunctivae normal.     Pupils: Pupils are equal, round, and reactive to light.  Cardiovascular:     Rate and Rhythm: Normal rate and regular rhythm.     Pulses: Normal pulses.  Heart sounds: Normal heart sounds. No murmur heard.    No friction rub. No gallop.  Pulmonary:     Effort: Pulmonary effort is normal. No respiratory distress.     Breath sounds: Normal breath sounds. No stridor. No wheezing, rhonchi or rales.  Abdominal:     General: Abdomen is flat. There is no distension. There are no signs of injury.     Palpations: Abdomen is soft.     Tenderness: There is generalized abdominal tenderness and tenderness in the epigastric area. There is no right CVA tenderness, left CVA tenderness, guarding or rebound. Negative signs include  Murphy's sign, Rovsing's sign, McBurney's sign, psoas sign and obturator sign.  Musculoskeletal:     Cervical back: Normal range of motion. No rigidity.     Right lower leg: No edema.     Left lower leg: No edema.  Skin:    General: Skin is warm and dry.     Coloration: Skin is not pale.     Findings: No bruising, erythema or lesion.  Neurological:     General: No focal deficit present.     Mental Status: She is alert and oriented to person, place, and time. Mental status is at baseline.     Sensory: No sensory deficit.     Motor: No weakness.     Gait: Gait normal.  Psychiatric:        Mood and Affect: Mood normal.     ED Results / Procedures / Treatments   Labs (all labs ordered are listed, but only abnormal results are displayed) Labs Reviewed  COMPREHENSIVE METABOLIC PANEL WITH GFR - Abnormal; Notable for the following components:      Result Value   Glucose, Bld 140 (*)    Calcium 8.4 (*)    AST 14 (*)    All other components within normal limits  CBC - Abnormal; Notable for the following components:   WBC 13.4 (*)    All other components within normal limits  URINALYSIS, ROUTINE W REFLEX MICROSCOPIC - Abnormal; Notable for the following components:   APPearance HAZY (*)    Specific Gravity, Urine 1.038 (*)    Ketones, ur 40 (*)    Protein, ur 30 (*)    Leukocytes,Ua SMALL (*)    Bacteria, UA RARE (*)    All other components within normal limits  LIPASE, BLOOD  PREGNANCY, URINE    EKG None  Radiology CT ABDOMEN PELVIS W CONTRAST Result Date: 11/21/2023 CLINICAL DATA:  Abdominal pain, acute, nonlocalized EXAM: CT ABDOMEN AND PELVIS WITH CONTRAST TECHNIQUE: Multidetector CT imaging of the abdomen and pelvis was performed using the standard protocol following bolus administration of intravenous contrast. RADIATION DOSE REDUCTION: This exam was performed according to the departmental dose-optimization program which includes automated exposure control, adjustment of  the mA and/or kV according to patient size and/or use of iterative reconstruction technique. CONTRAST:  OMNIPAQUE IOHEXOL 300 MG/ML  SOLN COMPARISON:  None Available. FINDINGS: Lower chest: No acute abnormality. Hepatobiliary: No focal liver abnormality is seen. No gallstones, gallbladder wall thickening, or biliary dilatation. Pancreas: Unremarkable Spleen: Unremarkable Adrenals/Urinary Tract: Adrenal glands are unremarkable. Kidneys are normal, without renal calculi, focal lesion, or hydronephrosis. Bladder is unremarkable. Stomach/Bowel: Stomach is within normal limits. Appendix appears normal. No evidence of bowel wall thickening, distention, or inflammatory changes. Vascular/Lymphatic: No significant vascular findings are present. No enlarged abdominal or pelvic lymph nodes. Reproductive: Uterus and bilateral adnexa are unremarkable. Other: No abdominal wall hernia or abnormality.  No abdominopelvic ascites. Musculoskeletal: Degenerative disc disease L5-S1. Osseous structures are otherwise unremarkable IMPRESSION: 1. No acute intra-abdominal pathology identified. No definite radiographic explanation for the patient's reported symptoms. Electronically Signed   By: Helyn Numbers M.D.   On: 11/21/2023 22:43    Procedures Procedures    Medications Ordered in ED Medications  sodium chloride 0.9 % bolus 1,000 mL (1,000 mLs Intravenous New Bag/Given 11/21/23 2222)  famotidine (PEPCID) IVPB 20 mg premix (0 mg Intravenous Stopped 11/21/23 2248)  pantoprazole (PROTONIX) injection 40 mg (40 mg Intravenous Given 11/21/23 2223)  iohexol (OMNIPAQUE) 300 MG/ML solution 100 mL (100 mLs Intravenous Contrast Given 11/21/23 2229)    ED Course/ Medical Decision Making/ A&P                                 Medical Decision Making Amount and/or Complexity of Data Reviewed Labs: ordered. Radiology: ordered. ECG/medicine tests: ordered.  Risk Prescription drug management.   This patient is a 48 year old  female who presents to the ED for concern of intermittent epigastric abdominal pain that radiates to the rest of the abdomen accompanied with nausea, pain, diarrhea x 9 days that specifically worse after meals.  Suspect PUD versus GB versus cholecystitis versus pancreatitis.  On physical exam, patient is mildly tender to the epigastric region, negative Murphy sign, negative Rovsing sign, negative McBurney's point.  No CVA tenderness.  Patient is noted to be in no acute distress, afebrile, speaking full sentences, alert & orient x4.  LCTAB.  Unremarkable exam otherwise.  CBC notes an elevated white count of 13.4 as well as a CMP that notes mild hypocalcemia of 8.4.  Urine notes ketones and proteins in the urine with elevated specific gravity.  Lipase is unremarkable, pregnancy test is negative.  Patient provided famotidine and Protonix as well as 1 L of normal saline.  On reevaluation, patient states that she is feeling "normal."  With no pain currently.  CT scan was unremarkable.  Will have her follow-up with GI for further evaluation as she still has an elevated white count and postprandial epigastric pain which I suspect is likely due to either Mounjaro related gastroparesis, GERD, PUD, GERD.  Will provide outpatient Protonix and famotidine.  Patient vital signs remained stable to the course of her time here.  I have low suspicion for any other emergent pathology present this time.  Patient has been able to tolerate p.o. intake and has not vomited since.  Will provide Zofran as well.  I believe patient safe discharge at this time.  Patient expressed agreement and understanding of plan.  All questions were answered.    Differential diagnoses prior to evaluation: The emergent differential diagnosis includes, but is not limited to, PUD, acute DVT, cholecystitis, pancreatitis, diverticulitis, ACS, PE, GERD, gastroparesis. This is not an exhaustive differential.   Past Medical History / Co-morbidities /  Social History: Thyroid cancer, diabetes  Additional history: Chart reviewed. Pertinent results include: Seen by [facility] physicians today where she was noted to have vomited while in the clinic and had a particularly foul smelling vomit.  Told to come to the ED for dehydration  Lab Tests/Imaging studies: I personally interpreted labs/imaging and the pertinent results include:  CBC notes an elevated white count of 13.4 as well as a CMP that notes mild hypocalcemia of 8.4.  Urine notes ketones and proteins in the urine with elevated specific gravity.  Lipase is unremarkable, pregnancy test is  negative.  CT scan is unremarkable with no bowel wall thickening, gallbladder wall thickening or biliary dilation, no stones present.  I agree with the radiologist interpretation.  Cardiac monitoring: EKG obtained and interpreted by myself and attending physician which shows: NSR   Medications: I ordered medication including famotidine, Protonix, normal saline.  I have reviewed the patients home medicines and have made adjustments as needed.   Disposition: After consideration of the diagnostic results and the patients response to treatment, I feel that the patient would benefit from discharge treatment as above.   emergency department workup does not suggest an emergent condition requiring admission or immediate intervention beyond what has been performed at this time. The plan is: Follow-up with GI, Protonix and famotidine for abdominal discomfort, Zofran for nausea, return for any new or worsening symptoms. The patient is safe for discharge and has been instructed to return immediately for worsening symptoms, change in symptoms or any other concerns.  Final Clinical Impression(s) / ED Diagnoses Final diagnoses:  Epigastric abdominal pain    Rx / DC Orders ED Discharge Orders          Ordered    famotidine (PEPCID) 20 MG tablet  2 times daily        11/21/23 2343    pantoprazole (PROTONIX) 40 MG  tablet  Daily        11/21/23 2343    ondansetron (ZOFRAN-ODT) 4 MG disintegrating tablet  Every 8 hours PRN        11/21/23 2343              Lunette Stands, PA-C 11/21/23 2344    Linwood Dibbles, MD 11/24/23 [phone]

## 2023-11-21 NOTE — ED Triage Notes (Signed)
 Abdo pain, n/v/d Gas bloating x 9 days off and on. Seen at [facility] and sent for eval  Reports "period of turning yellow"

## 2024-02-23 ENCOUNTER — Other Ambulatory Visit: Payer: Self-pay | Admitting: Family Medicine

## 2024-02-23 ENCOUNTER — Ambulatory Visit
Admission: RE | Admit: 2024-02-23 | Discharge: 2024-02-23 | Disposition: A | Discharge status: Home / Self Care | Source: Ambulatory Visit | Attending: Family Medicine | Admitting: Family Medicine

## 2024-02-23 DIAGNOSIS — M25559 Pain in unspecified hip: Secondary | ICD-10-CM
